# Patient Record
Sex: Male | Born: 1965 | Race: White | Hispanic: No | Marital: Single
Health system: Midwestern US, Community
[De-identification: ages and names within clinical notes are randomized; demographics above are authoritative.]

## PROBLEM LIST (undated history)

## (undated) DIAGNOSIS — M549 Dorsalgia, unspecified: Secondary | ICD-10-CM

## (undated) DIAGNOSIS — J449 Chronic obstructive pulmonary disease, unspecified: Principal | ICD-10-CM

## (undated) DIAGNOSIS — G8929 Other chronic pain: Secondary | ICD-10-CM

## (undated) DIAGNOSIS — IMO0002 Reserved for concepts with insufficient information to code with codable children: Secondary | ICD-10-CM

---

## 2014-08-29 NOTE — ED Provider Notes (Signed)
PATIENT:          Jacob Sawyer, Jacob Sawyer          DOS:           08/29/2014  MR #:             [id number]             ACCOUNT #:     [id number]  DATE OF BIRTH:    Mar 08, 1966              AGE:           48      HISTORY OF PRESENT ILLNESS:    PERTINENT HISTORY OF PRESENT  ILLNESS. Patient seen under the  supervision of  Dr. Lonia MadSheila Steer.  Patient resents to the emergency department with 4  to  five-day history of right-sided flank pain.  He states this came on  gradually  but has been continuous in nature with a sharp quality in his right  flank.  Patient reports this is worse when riding in the car, but not affected  by  movement or food.  He denies any associated nausea, vomiting,  diarrhea, or  melena.  He does endorse some urinary symptoms including dysuria and  frequency.   Patient was reportedly seen at [facility] yesterday for similar  complaints.  At that visit, a Foley catheter was placed which improved some of his  retention, but he states no further workup was obtained for stones and  he was  not treated for pain.    PERTINENT PAST/ FAMILY/SOCIAL HISTORY Medical: None  Surgical: Appendectomy  Social: Endorses tobacco use      PHYSICAL EXAM Vital signs unremarkable  General: Lying comfortably in bed  HEENT: normocephalic, nontraumatic, neck supple  CVS: Regular rate and rhythm  Resp: No distress, CTA bilaterally  Abd: Soft, nondistended, normal bowel sounds, tender to palpation  right lower  cord and right flank  Genitourinary: Foley catheter in place draining yellow urine, normal  scrotal  exam with no tenderness  Ext: Nontender, no edema  Back: Nontender, right-sided CVA tenderness  Neuro: Alert and oriented x3, normal strength and sensation  Psych: Normal affect    MEDICAL DECISION MAKING:    SIGNIFICANT FINDINGS/ED COURSE/MEDICAL DECISION MAKING/TREATMENT  PLAN Patient  presents with right-sided flank pain.  He was given Toradol and Zofran  for pain  and nausea control, respectively.  Retroperitoneal  ultrasound  obtained  demonstrating no evidence of hydronephrosis, with nonobstructing right  lower  pole renal calculus.  Complete blood count and metabolic panel  unremarkable.  Urinalysis demonstrating some occult blood, but no evidence of urinary  tract  infection.  Given findings, patient is medically stable for discharge  to home.  Upon reassessment at 2300, patient reports improvement of his pain.  He will be  discharged home with prescription for Naprosyn for pain control and  instructions for Foley catheter care, as well as followup with urology  clinic.  He is in agreement with this plan.    PROBLEM LIST:       Admit Reason:     right flank pain: Entered Date: 29-Aug-2014 21:07, Entered By:  INTERFACES,  INTERFACES      DIAGNOSIS 1.  Right-sided flank pain, acute      ADDITIONAL INFORMATION The Emergency Medicine attending physician  was present  in the Emergency Department, who reviewed case management, and  approved  evaluation/treatment.    Electronic Signatures:  Leo RodSTEER, SHEILA H (MD)  (  Signed 30-Aug-2014 01:14)   Co-Signer: HISTORY OF PRESENT ILLNESS, PROBLEM LIST  Ananias PilgrimSTEUBS, Marwin Primmer T (MD)  (Signed 29-Aug-2014 23:03)   Authored: HISTORY OF PRESENT ILLNESS, PHYSICAL EXAM, MEDICAL DECISION  MAKING,  PROBLEM LIST, DIAGNOSIS, Additional Infomation      Last Updated: 30-Aug-2014 01:14 by Leo RodSTEER, SHEILA H (MD)            Please see T-Sheet, initial assessment, and physician orders for  further details.    Dictating Physician: Clover MealyJohn Quinlan Vollmer, MD  Original Electronic Signature Date: 08/29/2014 09:55 P  JTS  Document #: [id number]    cc:  PCP No       Soarian

## 2014-08-30 ENCOUNTER — Inpatient Hospital Stay
Admit: 2014-08-30 | Discharge: 2014-08-30 | Disposition: A | Discharge status: Home / Self Care | Attending: Emergency Medicine

## 2014-08-30 LAB — CBC WITH AUTO DIFFERENTIAL
Absolute Baso #: 0.1 10*3/uL (ref 0.0–0.2)
Absolute Eos #: 0.2 10*3/uL (ref 0.0–0.5)
Absolute Lymph #: 2.3 10*3/uL (ref 1.0–4.3)
Absolute Mono #: 0.7 10*3/uL (ref 0.0–0.8)
Absolute Neut #: 4.8 10*3/uL (ref 1.8–7.0)
Basophils: 0.9 %
Eosinophils: 1.9 %
Granulocytes %: 59.7 %
Hematocrit: 39.1 % — ABNORMAL LOW (ref 40.0–52.0)
Hemoglobin: 13 g/dl (ref 13.0–18.0)
Lymphocyte %: 28.6 %
MCH: 32.5 pg (ref 26.0–34.0)
MCHC: 33.3 % (ref 32.0–36.0)
MCV: 97.5 fl (ref 80.0–98.0)
MPV: 7.6 fl (ref 7.4–10.4)
Monocytes: 8.9 %
Platelets: 204 10*3/uL (ref 140–440)
RBC: 4.01 10*6/uL — ABNORMAL LOW (ref 4.40–5.90)
RDW: 13.7 % (ref 11.5–14.5)
WBC: 8 10*3/uL (ref 3.6–10.7)

## 2014-08-30 LAB — BASIC METABOLIC PANEL
Anion Gap: 7
BUN: 15 mg/dL (ref 7–25)
CO2: 28 mmol/L (ref 21–32)
Calcium: 8.6 mg/dL (ref 8.2–10.1)
Chloride: 107 mmol/L (ref 98–109)
Creatinine: 0.68 mg/dL (ref 0.60–1.50)
EGFR IF NonAfrican American: >60 mL/min (ref >60)
Glucose: 88 mg/dL (ref 70–100)
Potassium: 3.8 mmol/L (ref 3.5–5.1)
Sodium: 142 mmol/L (ref 135–145)
eGFR African American: >60 mL/min (ref >60)

## 2014-08-30 LAB — URINALYSIS, MICRO: Epithelial Cells, Wet Prep: NEGATIVE /HPF (ref 3–5)

## 2014-08-30 LAB — URINALYSIS-MACROSCOPIC
Bilirubin, Urine: NEGATIVE
Ketones, Urine: NEGATIVE mg/dL
Leukocytes, Bld: NEGATIVE
Nitrite, Urine: NEGATIVE
Occult Blood,Urine: 50 Ery/micL
Specific Gravity, Urine: 1.005 (ref 1.005–1.030)
Total Protein, Urine: NEGATIVE mg/dL
Volume: 12
pH, Urine: 5 (ref 5.0–8.0)

## 2014-09-03 ENCOUNTER — Inpatient Hospital Stay
Admit: 2014-09-03 | Discharge: 2014-09-03 | Disposition: A | Discharge status: Home / Self Care | Attending: Emergency Medicine

## 2014-09-03 LAB — CBC WITH AUTO DIFFERENTIAL
Absolute Baso #: 0 10*3/uL (ref 0.0–0.2)
Absolute Eos #: 0.1 10*3/uL (ref 0.0–0.5)
Absolute Lymph #: 2 10*3/uL (ref 1.0–4.3)
Absolute Mono #: 0.6 10*3/uL (ref 0.0–0.8)
Absolute Neut #: 4 10*3/uL (ref 1.8–7.0)
Basophils: 0.5 %
Eosinophils: 1.9 %
Granulocytes %: 59.3 %
Hematocrit: 38.9 % — ABNORMAL LOW (ref 40.0–52.0)
Hemoglobin: 12.9 g/dl — ABNORMAL LOW (ref 13.0–18.0)
Lymphocyte %: 28.9 %
MCH: 32.7 pg (ref 26.0–34.0)
MCHC: 33.3 % (ref 32.0–36.0)
MCV: 98 fl (ref 80.0–98.0)
MPV: 8 fl (ref 7.4–10.4)
Monocytes: 9.4 %
Platelets: 203 10*3/uL (ref 140–440)
RBC: 3.97 10*6/uL — ABNORMAL LOW (ref 4.40–5.90)
RDW: 13.8 % (ref 11.5–14.5)
WBC: 6.8 10*3/uL (ref 3.6–10.7)

## 2014-09-03 LAB — URINALYSIS-MACROSCOPIC
Bilirubin, Urine: NEGATIVE
Ketones, Urine: NEGATIVE mg/dL
Leukocytes, Bld: NEGATIVE
Nitrite, Urine: NEGATIVE
Occult Blood,Urine: NEGATIVE Ery/micL
Specific Gravity, Urine: 1.01 (ref 1.005–1.030)
Total Protein, Urine: NEGATIVE mg/dL
Volume: 12
pH, Urine: 8 (ref 5.0–8.0)

## 2014-09-03 LAB — BASIC METABOLIC PANEL
Anion Gap: 6
BUN: 12 mg/dL (ref 7–25)
CO2: 29 mmol/L (ref 21–32)
Calcium: 8.9 mg/dL (ref 8.2–10.1)
Chloride: 107 mmol/L (ref 98–109)
Creatinine: 0.64 mg/dL (ref 0.60–1.50)
EGFR IF NonAfrican American: >60 mL/min (ref >60)
Glucose: 74 mg/dL (ref 70–100)
Potassium: 4 mmol/L (ref 3.5–5.1)
Sodium: 142 mmol/L (ref 135–145)
eGFR African American: >60 mL/min (ref >60)

## 2014-09-03 NOTE — ED Provider Notes (Signed)
PATIENT:          Jacob Sawyer, Jacob Sawyer          DOS:           09/03/2014  MR #:             [id number]             ACCOUNT #:     [id number]  DATE OF BIRTH:    08-08-1966              AGE:           48      HISTORY OF PRESENT ILLNESS:    PERTINENT HISTORY OF PRESENT  ILLNESS. The patient was seen under  the  supervision of Dr. Jesus GeneraPeter Listerman.  This is a 48 year old male who  presents  with right flank pain.  This is his third emergency department visit  with him  last week for similar symptoms.  He originally went to [facility]  because he  could not urinate and a catheter was placed.  Then the next couple of  days he  continued to have severe right flank pain and was seen in the  emergency  department here.  Nothing was found at that time.  He has been in  intermittent  severe pain since then.  He has had intermittent diarrhea but denies  nausea,  vomiting, fever or chills.  He did notice blood in his Foley bag  earlier today.   He does not have other complaints at this time.    PERTINENT PAST/ FAMILY/SOCIAL HISTORY PMH: None  PSH: Appendectomy  Social HX: Admits to tobacco use.  Denies alcohol or illicit drug use      PHYSICAL EXAM Vital signs: Within normal limits  General: Well-nourished and well-developed male lying in bed in no  acute  distress  Head: Normocephalic and atraumatic  Eyes: Pupils are equally round and reactive to light with extraocular  eye  movements intact.  Neck: Supple without lymphadenopathy  CVS: Regular rate and rhythm without murmurs  Respiratory: Lungs are clear to auscultation bilaterally with no  audible  wheezing or rhonchi  Abdomen: Soft, nontender, nondistended with normal bowel sounds  throughout.  There are no palpable masses.  Back: Right CVA tenderness  Extremities: Nontender and nonedematous  Neurological: Normal strength and sensation bilaterally    MEDICAL DECISION MAKING:    SIGNIFICANT FINDINGS/ED COURSE/MEDICAL DECISION MAKING/TREATMENT  PLAN He was  treated with morphine  and Zofran.  Labs, urinalysis and CT were  obtained.  Complete blood count was unremarkable with a slightly low hemoglobin  of 12.9.  BMP was was unremarkable.  His urinalysis was negative for leukocytes,  nitrates  or blood.  A computed tomography scan did reveal a right instructing  rhythm  without lives with no evidence of urolithiasis or other acute  disease.  I reviewed the patient's records and he had a urine obtained a couple  of days  ago which revealed calcium oxalate crystals.  He has had intermittent  pain  since then.  He has a stone in his right kidney but none in the ureter  today.  It is possible that he could be passing small stones.  He will be  treated for a  kidney stone with a short course of Percocet, Flomax and Naprosyn.  His foley  catheter was removed since it has been in for 10 days he was  instructed to  followup with urology as scheduled.  He did want his catheter  removed.    PROBLEM LIST:       Admit Reason:     flank pain: Entered Date: 03-Sep-2014 14:38, Entered By:  Standley BrookingINTERFACES,  INTERFACES      DIAGNOSIS RIGHT FLANK PAIN      ADDITIONAL INFORMATION If the physician assistant/nurse practitioner  was  involved in patient care, I personally performed and participated in  all the  above services (including HPI and PE). I have reviewed with the  physician  assistant/nurse practitioner the history and confirmed the findings  with the  patient. I personally performed all surgical procedures in the medical  record  unless otherwise indicated.    COPIES SENT TO::     NO, PCP DOCTOR(PCP): [id number]    Electronic Signatures:  Jesus GeneraLISTERMAN, PETER (MD)  (Signed 03-Sep-2014 18:00)   Authored: MEDICAL DECISION MAKING   Co-Signer: HISTORY OF PRESENT ILLNESS, PHYSICAL EXAM, PROBLEM LIST,  Additional  Infomation, Copies to be sent to:  Sanjuan DameMARCHAK, Rhyse Loux G (PA)  (Signed 03-Sep-2014 17:55)   Authored: HISTORY OF PRESENT ILLNESS, PHYSICAL EXAM, MEDICAL DECISION  MAKING,  PROBLEM LIST, DIAGNOSIS, Additional  Infomation, Copies to be sent to:      Last Updated: 03-Sep-2014 18:00 by Jesus GeneraLISTERMAN, PETER (MD)            Please see T-Sheet, initial assessment, and physician orders for  further details.    Dictating Physician: Sanjuan DameStephanie G Kyrah Schiro, PA  Original Electronic Signature Date: 09/03/2014 02:52 P  SGM  Document #: [id number]    cc:  PCP No       Soarian

## 2014-09-20 ENCOUNTER — Inpatient Hospital Stay
Admit: 2014-09-20 | Discharge: 2014-09-20 | Disposition: A | Discharge status: Home / Self Care | Attending: Emergency Medicine

## 2014-09-20 LAB — URINALYSIS-MACROSCOPIC
Bilirubin, Urine: NEGATIVE
Ketones, Urine: NEGATIVE mg/dL
Leukocytes, Bld: NEGATIVE
Nitrite, Urine: NEGATIVE
Occult Blood,Urine: NEGATIVE Ery/micL
Specific Gravity, Urine: 1.01 (ref 1.005–1.030)
Total Protein, Urine: NEGATIVE mg/dL
Volume: 12
pH, Urine: 8 (ref 5.0–8.0)

## 2014-09-20 NOTE — ED Provider Notes (Signed)
PATIENT:          Jacob Sawyer, Jacob Sawyer          DOS:           09/20/2014  MR #:             [id number]             ACCOUNT #:     [id number]  DATE OF BIRTH:    [birth year]              AGE:           48      HISTORY OF PRESENT ILLNESS:    PERTINENT HISTORY OF PRESENT  ILLNESS. Patient chief complaint of  blood in  his urine.  He was seen in the ED earlier today and given Pyridium for  his  bladder pain.  He did not know that it would turn his urine different  color.    PERTINENT PAST/ FAMILY/SOCIAL HISTORY See previous note      PHYSICAL EXAM Vitals stable.  Exam unremarkable    MEDICAL DECISION MAKING:    SIGNIFICANT FINDINGS/ED COURSE/MEDICAL DECISION MAKING/TREATMENT  PLAN Patient  reassured about his urine color and asked to followup with urology    PROBLEM LIST:       ED Diagnosis:     Medication side effect (T88.7XXA): Entered Date: 20-Sep-2014 17:42,  Entered  By: Samule Ohm, Status: Active, ICD-10: T88.7XXA     Urinary retention (R33.9): Entered Date: 20-Sep-2014 17:42, Entered  By:  Samule Ohm, Status: Active, ICD-10: R33.9    COPIES SENT TO::     NO, PCP DOCTOR(PCP): [id number]    Electronic Signatures:  Samule Ohm (MD)  (Signed [phone] 17:42)   Authored: HISTORY OF PRESENT ILLNESS, PHYSICAL EXAM, MEDICAL DECISION  MAKING,  PROBLEM LIST, Copies to be sent to:      Last Updated: 20-Sep-2014 17:42 by Samule Ohm (MD)            Please see T-Sheet, initial assessment, and physician orders for  further details.    Dictating Physician: Samule Ohm, MD  Original Electronic Signature Date: 09/20/2014 05:42 P  Document #: [id number]    cc:  PCP No       Soarian

## 2014-09-20 NOTE — ED Provider Notes (Signed)
PATIENT:          Jacob Sawyer, Jacob Sawyer          DOS:           09/20/2014  MR #:             [id number]             ACCOUNT #:     [id number]  DATE OF BIRTH:    [birth year]              AGE:           48      HISTORY OF PRESENT ILLNESS:    PERTINENT HISTORY OF PRESENT  ILLNESS. This patient was seen with  Dr.  Delene Ruffini.  Chief complaint: Urinary retention  Patient presents complaining of urinary retention with last void at 10  p.m.  last evening, over 12 hours ago.  He has had symptoms like this in the  past and  has been seen in the emergency room for this a month ago.  He had a  Foley  catheter placed in the past which was removed last month.  Since then  he has  had difficulty starting a stream but is typically able to void.  He  has seen a  urologist who did a cystoscopy reporting normal findings, normal  prostate.  He  cannot recall the name of his urologist but he does have an  appointment to see  him in 2 weeks.  He has mild bladder pain with this    PERTINENT PAST/ FAMILY/SOCIAL HISTORY PMH: Kidney stones  PSH: Appendectomy  Social history: Smokes cigarettes      PHYSICAL EXAM Signs are noted and stable.  Well-nourished,  well-developed, no  acute distress.  He has a regular heart rate and rhythm with no  murmurs.  He is  in no respiratory distress and lungs are clear to auscultation  bilaterally.  Back is nontender, no CVA tenderness.  Abdomen is nondistended with  normal  bowel sounds and soft palpation.  He does have some mild suprapubic  tenderness  on palpation.    MEDICAL DECISION MAKING:    SIGNIFICANT FINDINGS/ED COURSE/MEDICAL DECISION MAKING/TREATMENT  PLAN  Indwelling Foley catheter placed by nursing which drained 150 mL of  clear  yellow urine.  Patient reports improvement after this.  He is given  Pyridium  for his discomfort.  Urinalysis shows no sign of infection.  He will  be  discharged with a leg bag and followup with his urologist as  scheduled.  He  will return with any worsening  symptoms.    PROBLEM LIST:         ED Diagnosis:     Urinary retention (R33.9): Entered Date: 20-Sep-2014 12:43, Entered  By:  Samule Ohm, Status: Active, ICD-10: R33.9      DIAGNOSIS Urinary retention      ADDITIONAL INFORMATION If the physician assistant/nurse practitioner  was  involved in patient care, I personally performed and participated in  all the  above services (including HPI and PE). I have reviewed with the  physician  assistant/nurse practitioner the history and confirmed the findings  with the  patient. I personally performed all surgical procedures in the medical  record  unless otherwise indicated.    COPIES SENT TO::     NO, PCP DOCTOR(PCP): [id number]    Electronic Signatures:  Janean Sark L (NP)  (Signed [phone] 12:37)   Authored: HISTORY OF PRESENT ILLNESS, PHYSICAL  EXAM, MEDICAL DECISION  MAKING,  PROBLEM LIST, DIAGNOSIS, Additional Infomation, Copies to be sent to:  Samule Ohm (MD)  (Signed [phone] 12:43)   Authored: PROBLEM LIST   Co-Signer: HISTORY OF PRESENT ILLNESS, PHYSICAL EXAM, MEDICAL  DECISION MAKING,  PROBLEM LIST, DIAGNOSIS, Additional Infomation, Copies to be sent to:      Last Updated: 20-Sep-2014 12:43 by Samule Ohm (MD)            Please see T-Sheet, initial assessment, and physician orders for  further details.    Dictating Physician: Edwinna Areola, CNP  Original Electronic Signature Date: 09/20/2014 12:37 P  JE  Document #: [id number]    cc:  PCP No       Soarian

## 2014-09-25 ENCOUNTER — Inpatient Hospital Stay: Admit: 2014-09-25 | Discharge: 2014-09-25 | Disposition: A | Discharge status: Home / Self Care | Attending: Hospitalist

## 2014-09-25 LAB — URINALYSIS-MACROSCOPIC
Bilirubin, Urine: NEGATIVE
Ketones, Urine: NEGATIVE mg/dL
Leukocytes, Bld: NEGATIVE
Nitrite, Urine: NEGATIVE
Occult Blood,Urine: 50 Ery/micL
Specific Gravity, Urine: 1.01 (ref 1.005–1.030)
Total Protein, Urine: NEGATIVE mg/dL
Volume: 12
pH, Urine: 8 (ref 5.0–8.0)

## 2014-09-25 LAB — URINALYSIS, MICRO: Epithelial Cells, Wet Prep: NEGATIVE /HPF (ref 3–5)

## 2014-09-25 NOTE — ED Provider Notes (Signed)
PATIENT:          Jacob Sawyer, Jacob Sawyer          DOS:           09/25/2014  MR #:             [id number]             ACCOUNT #:     [id number]  DATE OF BIRTH:    19-Mar-1966              AGE:           48      HISTORY OF PRESENT ILLNESS:    PERTINENT HISTORY OF PRESENT  ILLNESS. The patient was seen and  examined with  Dr. Janie Morning.  This is a 48 year old male who presents with a  chief  complaint of catheter irritation that began suddenly today while he  was outside  chopping wood.  The patient states he has sharp suprapubic pain,  penile pain,  and scrotum pain that is worsened by walking and relieved by rest.  Additionally, the patient states he has had dysuria for the past 2  days as well  as bilateral flank pain and urinary urgency.  The patient states he  had a  catheter placed 5 days ago due to urinary retention.  The patient has  an  appointment with urologist Dr. Hazle Quant in one week, but is concerned  about the  new pain and irritation he is having.  The patient states he has a  known kidney  stone.  Also, the patient has had diarrhea for the past 4-5 days.  The  patient  denies hematuria, nausea, vomiting, and fever.    PERTINENT PAST/ FAMILY/SOCIAL HISTORY Muscle spasms, on Neurontin  Tobacco abuse      PHYSICAL EXAM Vital signs are normal and patient is afebrile.  Patient is a  well-developed, well-nourished male who appears nontoxic and in no  acute  distress.  Head is normocephalic and atraumatic.  Pupils are equal,  round, and  reactive to light.  Extraocular movements are intact. Ears, nose, and  throat  are unremarkable.  Neck is supple.  Regular heart rate and rhythm.  Normal S1  and S2.  No respiratory distress.  Diminished lung sounds bilaterally.  Abdomen  is soft and nondistended with normal bowel sounds.  Suprapubic  tenderness to  palpation. No abdominal masses are felt.  No CVA tenderness.  Extremities are  nontender with no edema.  Foley catheter in place with no penile  discharge  or  cellulitis.  Catheter draining yellow urine. Skin is normal color.  Patient is  alert and oriented.  Cranial nerves II through XII are intact.    MEDICAL DECISION MAKING:    SIGNIFICANT FINDINGS/ED COURSE/MEDICAL DECISION MAKING/TREATMENT  PLAN The  patient's Foley catheter was removed and the patient tolerated this  procedure  well.  The patient was able to provide a urine sample after removing  the Foley  catheter without any difficulty urinating.  The patient was given one  Percocet  for his pain.  Urinalysis showed no infection.  The patient did have  blood and  red blood cells in the urine with amorphous phosphates and few calcium  oxylate  crystals, likely indicating a kidney stone.  The patient was given one  dose of  Flomax here in the Emergency Department.  The patient will also be  given a  prescription of Flomax for home.  The  patient should followup with his  PCP if  he continues to have pain and dysuria.  The patient should keep his  appointment  with his urologist next week.  The patient understands discharge  instructions  and is agreeable to the plan.  The patient is stable.    PROBLEM LIST:       ED Diagnosis:     Encounter for Foley catheter removal (Z46.6): Entered Date:  25-Sep-2014  18:45, Entered By: Maeola Sarah, Status: Active, ICD-10: Z46.6     Hematuria (R31.9): Entered Date: 25-Sep-2014 18:44, Entered By:  Maeola Sarah, Status: Active, ICD-10: R31.9       Admit Reason:     Cath Problems: Entered Date: 25-Sep-2014 17:20, Entered By:  Denton Ar, Status: Active      DIAGNOSIS 1.  Hematuria  2.  Foley catheter removal      ADDITIONAL INFORMATION The Emergency Medicine attending physician  was present  in the Emergency Department, who reviewed case management, and  approved  evaluation/treatment.    COPIES SENT TO::     NO, PCP DOCTOR(PCP): [id number]    Electronic Signatures:  Maeola Sarah (MD)  (Signed 25-Sep-2014 18:45)   Authored: HISTORY OF PRESENT ILLNESS,  PHYSICAL EXAM, MEDICAL DECISION  MAKING,  PROBLEM LIST, DIAGNOSIS, Additional Infomation, Copies to be sent to:  Janie Morning (MD)  (Signed 25-Sep-2014 19:23)   Co-Signer: HISTORY OF PRESENT ILLNESS, PROBLEM LIST, Copies to be  sent to:      Last Updated: 25-Sep-2014 19:23 by Janie Morning (MD)            Please see T-Sheet, initial assessment, and physician orders for  further details.    Dictating Physician: Maeola Sarah, MD  Original Electronic Signature Date: 09/25/2014 06:21 P  BMM  Document #: [id number]    cc:  PCP No       Soarian

## 2014-10-25 NOTE — ED Provider Notes (Signed)
PATIENT:          Jacob Sawyer, [name]          DOS:           10/25/2014  MR #:             [id number]             ACCOUNT #:     [id number]  DATE OF BIRTH:    1965/11/20              AGE:           48      HISTORY OF PRESENT ILLNESS:    PERTINENT HISTORY OF PRESENT  ILLNESS. Patient was seen with Dr.  Everlene OtherMcQuown.  Patient is a chief complaint of seizure episode.  Patient is brought  in by  paramedics.  Paramedics stated the patient had 6 seizure episodes of  marked  history.  There is redness and one episode, generalized tonic clonic  that  lasted less than 1 minute and self resolved.  Patient's last seizure  was one  week ago.  Patient has not taken Depakote in over 5 years.  Review of  system  positive for cough and dysuria.    PERTINENT PAST/ FAMILY/SOCIAL HISTORY Seizure  Appendectomy      PHYSICAL EXAM Vital signs are normal  Awake, alert  Left 2 cm scalp hematoma with dried blood  Pupils are equal round reactive  Chest: Regular, rate and rhythm.  No murmurs.  Respiratory: Clear to auscultation bilaterally.  Abdomen: Soft, nontender, nondistended.  No focal deficit    MEDICAL DECISION MAKING:    SIGNIFICANT FINDINGS/ED COURSE/MEDICAL DECISION MAKING/TREATMENT  PLAN CT head  and C-spine is negative for hemorrhage and fracture.  Complete blood  count, BMP  is unremarkable.  Chest x-ray is clear.  is normal.  Patient had 2  episode of  generalized tonic-clonic seizure activity, one was 20 seconds and the  second  one was 45 seconds it self resolved.  Patient is given weight-based  phenytoin.  On repeat exam, he is awake and alert, answering questions.  Patient  was  initially refusing admission however eventually is agreeable.  He had  another  seizure and was given ativan. He had another seizure and was admitted  to the  ICU alert and awake.    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    COPIES SENT TO::     NO, PCP DOCTOR(PCP): [id number]    Electronic Signatures:  Mitchell HeirKUMAR, Greidy Sherard (MD)  (Signed 25-Oct-2014 21:45)   Authored: HISTORY OF PRESENT ILLNESS, PHYSICAL EXAM, MEDICAL DECISION  MAKING,  PROBLEM LIST, DIAGNOSIS, Additional Infomation, Copies to be sent to:  Margo CommonMCQUOWN, COLLEEN MARIE (MD)  (Signed 02-Nov-2014 20:47)   Authored: MEDICAL DECISION MAKING, PROBLEM LIST   Co-Signer: HISTORY OF PRESENT ILLNESS, PHYSICAL EXAM, MEDICAL  DECISION MAKING,  PROBLEM LIST, DIAGNOSIS, Additional Infomation, Copies to be sent to:  Last Updated: 02-Nov-2014 20:47 by Margo CommonMCQUOWN, COLLEEN MARIE (MD)            Please see T-Sheet, initial assessment, and physician orders for  further details.    Dictating Physician: Mitchell HeirNeha Ranell Finelli, MD  Original Electronic Signature Date: 10/25/2014 09:45 P  NK  Document #: [id number]    cc:  PCP No       Soarian

## 2014-10-26 ENCOUNTER — Inpatient Hospital Stay
Admit: 2014-10-26 | Discharge: 2014-10-26 | Disposition: A | Discharge status: Other Acute Inpatient Hospital | Admitting: Critical Care Medicine

## 2014-10-26 ENCOUNTER — Inpatient Hospital Stay
Admit: 2014-10-26 | Discharge: 2014-10-29 | Disposition: A | Discharge status: Home / Self Care | Attending: Psychiatry | Admitting: Psychiatry

## 2014-10-26 LAB — BASIC METABOLIC PANEL
Anion Gap: 7
Anion Gap: 6
BUN: 15 mg/dL (ref 7–25)
BUN: 12 mg/dL (ref 7–25)
CO2: 29 mmol/L (ref 21–32)
CO2: 29 mmol/L (ref 21–32)
Calcium: 8.8 mg/dL (ref 8.2–10.1)
Calcium: 8.5 mg/dL (ref 8.2–10.1)
Chloride: 106 mmol/L (ref 98–109)
Chloride: 110 mmol/L — ABNORMAL HIGH (ref 98–109)
Creatinine: 0.94 mg/dL (ref 0.60–1.50)
Creatinine: 0.8 mg/dL (ref 0.60–1.50)
EGFR IF NonAfrican American: >60 mL/min (ref >60)
EGFR IF NonAfrican American: >60 mL/min (ref >60)
Glucose: 120 mg/dL — ABNORMAL HIGH (ref 70–100)
Glucose: 86 mg/dL (ref 70–100)
Potassium: 3.8 mmol/L (ref 3.5–5.1)
Potassium: 3.8 mmol/L (ref 3.5–5.1)
Sodium: 142 mmol/L (ref 135–145)
Sodium: 145 mmol/L (ref 135–145)
eGFR African American: >60 mL/min (ref >60)
eGFR African American: >60 mL/min (ref >60)

## 2014-10-26 LAB — HEPATIC FUNCTION PANEL
ALT: 23 U/L (ref 12–78)
AST: 25 U/L (ref 15–37)
Albumin,Serum: 3.8 g/dL (ref 3.1–4.6)
Alkaline Phosphatase: 103 U/L (ref 45–117)
Bilirubin, Direct: 0.1 mg/dL (ref 0.0–0.2)
Total Bilirubin: 0.6 mg/dL (ref 0.2–1.0)
Total Protein: 7.3 g/dL (ref 6.4–8.2)

## 2014-10-26 LAB — CBC WITH AUTO DIFFERENTIAL
Absolute Baso #: 0.1 10*3/uL (ref 0.0–0.2)
Absolute Eos #: 0.1 10*3/uL (ref 0.0–0.5)
Absolute Lymph #: 2.6 10*3/uL (ref 1.0–4.3)
Absolute Mono #: 0.8 10*3/uL (ref 0.0–0.8)
Absolute Neut #: 4.5 10*3/uL (ref 1.8–7.0)
Basophils: 0.6 %
Eosinophils: 1.6 %
Granulocytes %: 55.6 %
Hematocrit: 38.5 % — ABNORMAL LOW (ref 40.0–52.0)
Hemoglobin: 12.6 g/dl — ABNORMAL LOW (ref 13.0–18.0)
Lymphocyte %: 32.3 %
MCH: 32.1 pg (ref 26.0–34.0)
MCHC: 32.8 % (ref 32.0–36.0)
MCV: 97.8 fl (ref 80.0–98.0)
MPV: 7.7 fl (ref 7.4–10.4)
Monocytes: 9.9 %
Platelets: 200 10*3/uL (ref 140–440)
RBC: 3.94 10*6/uL — ABNORMAL LOW (ref 4.40–5.90)
RDW: 13.3 % (ref 11.5–14.5)
WBC: 8 10*3/uL (ref 3.6–10.7)

## 2014-10-26 LAB — DRUGS OF ABUSE, URINE
Amphetamines, urine: NEGATIVE
Barbiturates, Urine: NEGATIVE
Benzodiazepine Ur Qual: NEGATIVE
Cocaine Metabolites, Ur: NEGATIVE
Methadone, Urine: NEGATIVE
Opiates, Urine: NEGATIVE
Oxycodone Screen, Ur: NEGATIVE
PCP, Urine: NEGATIVE

## 2014-10-26 LAB — TSH: TSH: 1.38 micUnt/mL (ref 0.36–3.74)

## 2014-10-26 LAB — LACTIC ACID: Lactic Acid: 0.5 mmol/L (ref 0.4–2.0)

## 2014-10-26 LAB — CK-MB INDEX
CK-MB Index: 1 (ref 0.0–4.0)
CK-MB: 3.4 ng/mL (ref 0.5–3.6)

## 2014-10-26 LAB — CBC
Hematocrit: 35.2 % — ABNORMAL LOW (ref 40.0–52.0)
Hemoglobin: 11.6 g/dl — ABNORMAL LOW (ref 13.0–18.0)
MCH: 32 pg (ref 26.0–34.0)
MCHC: 33 % (ref 32.0–36.0)
MCV: 96.8 fl (ref 80.0–98.0)
MPV: 7.5 fl (ref 7.4–10.4)
Platelets: 178 10*3/uL (ref 140–440)
RBC: 3.63 10*6/uL — ABNORMAL LOW (ref 4.40–5.90)
RDW: 13.1 % (ref 11.5–14.5)
WBC: 6 10*3/uL (ref 3.6–10.7)

## 2014-10-26 LAB — URINALYSIS-MACROSCOPIC
Bilirubin, Urine: NEGATIVE
Ketones, Urine: NEGATIVE mg/dL
Leukocytes, Bld: NEGATIVE
Nitrite, Urine: NEGATIVE
Occult Blood,Urine: NEGATIVE Ery/micL
Specific Gravity, Urine: 1.01 (ref 1.005–1.030)
Total Protein, Urine: NEGATIVE mg/dL
Urobilinogen, Urine: 1 mg/dL (ref 0–1)
Volume: 12
pH, Urine: 7 (ref 5.0–8.0)

## 2014-10-26 LAB — PHOSPHORUS
Phosphorus: 3.3 mg/dL (ref 2.5–5.0)
Phosphorus: 3.7 mg/dL (ref 2.5–5.0)

## 2014-10-26 LAB — CK WITH REFLEX CK-MB: Total CK: 343 U/L — ABNORMAL HIGH (ref 39–308)

## 2014-10-26 LAB — ETHANOL: Ethanol Lvl: <0.003 g/dL (ref 0.000–0.003)

## 2014-10-26 LAB — PROLACTIN: Prolactin: 17.4 ng/mL

## 2014-10-26 LAB — MAGNESIUM
Magnesium: 1.9 mg/dL (ref 1.8–2.4)
Magnesium: 1.9 mg/dL (ref 1.8–2.4)

## 2014-10-29 LAB — COMPREHENSIVE METABOLIC PANEL
ALT: 26 U/L (ref 12–78)
AST: 36 U/L (ref 15–37)
Albumin,Serum: 3.6 g/dL (ref 3.1–4.6)
Alkaline Phosphatase: 102 U/L (ref 45–117)
Anion Gap: 8
BUN: 16 mg/dL (ref 7–25)
CO2: 29 mmol/L (ref 21–32)
Calcium: 8.9 mg/dL (ref 8.2–10.1)
Chloride: 105 mmol/L (ref 98–109)
Creatinine: 0.7 mg/dL (ref 0.60–1.50)
EGFR IF NonAfrican American: >60 mL/min (ref >60)
Glucose: 78 mg/dL (ref 70–100)
Potassium: 3.8 mmol/L (ref 3.5–5.1)
Sodium: 141 mmol/L (ref 135–145)
Total Bilirubin: 0.5 mg/dL (ref 0.2–1.0)
Total Protein: 7.4 g/dL (ref 6.4–8.2)
eGFR African American: >60 mL/min (ref >60)

## 2014-10-29 LAB — VALPROIC ACID LEVEL, TOTAL: Valproic Acid Lvl: 60 ug/mL (ref 50–100)

## 2014-10-29 LAB — AMMONIA: Ammonia: 20 mmol/L (ref 11–32)

## 2014-10-29 NOTE — Discharge Summary (Signed)
PATIENT:             Jacob Sawyer, Jacob Sawyer                     ADMIT DATE:            10/26/2014  MEDICAL RECORD NUMBER:     [id number]                        DISCHARGE DATE:        10/29/2014  ACCOUNT #:           [id number]                       DATE OF BIRTH:         06/29/1966                                                          AGE:                   48                                      Electronically Authenticated                           Niaomi Cartaya P. Tiburcio Pea, MD 10/30/2014 12:22 P    ATTENDING PHYSICIAN:  Lillianna Sabel P. Tiburcio Pea, MD    DICTATING PHYSICIAN:  Nixon Sparr P. Harris Penton, MD    TOTAL TIME SPENT ON DISCHARGE:   30 minutes.    INFORMATION AND REASON FOR HOSPITALIZATION:  This patient is a  48 year old divorced, white male, he was admitted with confusion,  irritability, and agitation, following a seizure.    HOSPITAL COURSE AND TREATMENT:  The patient was admitted to the  inpatient psychiatric unit at [facility].  He was  initially seen and treated at [facility] following a seizure.  There, he became very belligerent, agitated and threatening to leave.  He was seen by Dr. Leanna Sato, who transferred him to the [facility] unit for  further stabilization.  He stabilized quickly once he was placed on  Depakote.  He did not display any psychosis following the post-ictal  phase of his seizure, nor did he display any agitation or  irritability.  He was compliant throughout his entire stay on the  unit.  We did transfer him to the general psychiatric unit where he  was again monitored closely throughout his stay.  We worked with him  in regards to making arrangements for follow-up with the [facility]icine [facility] to help manage his medications for his seizure  disorder.  He was given the diagnosis of mood disorder, but there was  again, no evidence of psychosis, following this post-ictal period, nor  did he display any suicidal or homicidal ideation.  On the day of  discharge I met with the patient.  He was  pleasant and he was future  oriented.  Again, he states that this must have all occurred because  of his seizures. He had not been compliant with his Depakote and now  knows that he needs to take it  on a regular basis. He was thankful  that we are making arrangements for him to follow up with the Internal  Medicine Clinic, as well, as I strongly encouraged him to follow up  with Chana Bode, that we can continue to monitor his mood while on the  Depakote, as well as using the Depakote for his seizures.  He was  pleasant and future oriented.  He denied any suicidal or homicidal  ideation.    LABORATORY STUDIES:   His discharged labs were reviewed.  Depakote  level on the day of discharge was 60.  Ammonia level 20.  His CMP was  within normal limits.    CONDITION ON DISCHARGE:  Improved.    DISCHARGE MENTAL STATUS EXAMINATION:  In general, he is alert.  He is  oriented to person, place, date and time.  Friendly and cooperative.  Good eye contact.  Well-groomed, dressed in hospital clothing. His  gait and station were intact.  His muscle strength and tone was  intact.  He was well-groomed, dressed in his own clothing. His mood  was "better."  Affect was full and appropriate.  No lability noted.  Thought content was absent of any active suicidal or homicidal  ideation.  There was no psychosis.  Thought processes were goal  oriented, direct and linear.  His associations were intact.  His  insight and judgment showed significant improvement.    DISCHARGE DIAGNOSES:    AXIS I:  1.     MOOD DISORDER NOT OTHERWISE SPECIFIED.  2.     RULE OUT BIPOLAR DISORDER.  3.    RULE OUT MAJOR DEPRESSIVE DISORDER.                       AXIS II:   DEFERRED.    AXIS III:   SEIZURE DISORDER.    AXIS IV:  SEVERITY OF PSYCHOSOCIAL STRESSORS MILD.    AXIS V:  CURRENT GAF OF APPROXIMATELY 45.    DISCHARGE DIET:    Regular.    DISCHARGE ACTIVITY:   As tolerated.    DISCHARGE MEDICATIONS:  1.     Depakote 500 mg p.o. t.i.d.  2.     Flomax 0.4 mg p.o.  daily.    DISCHARGE INSTRUCTIONS:  The patient is being discharge to home. He is  to follow up with the Internal Medicine Clinic at [facility]  on 11/05/2014 at 3 p.m.  He will follow up [employer] for ongoing management and observation of his underlying mood  disorder.  He has been instructed that he can contract my office or  contact Ethan with any questions, concerns, or in crisis  prior to any outpatient appointments.            Trever Streater P. Tiburcio Pea, MD    DOD:10/29/2014 09:38 A  BW/tm  DOT:10/29/2014 10:04 A  Job Number: [id number]  Document Number: [id number]  cc:   Samyrah Bruster P. Tiburcio Pea, [place], [place] [address]

## 2014-11-05 ENCOUNTER — Encounter: Attending: Internal Medicine

## 2014-11-05 NOTE — Telephone Encounter (Signed)
Received records from [facility] for admission in 10/2014. Pt was admitted for seizures and started on depakote. He was also threatening to leave and was evaluated by Dr Shari ProwsIvan who was unable to rule out an underlying mood disorder. Per records pt threatened to jump off a bridge and had violent behavior and acting out. Per [facility], pt was stabilized on depakote prior to discharge

## 2014-11-23 ENCOUNTER — Ambulatory Visit: Admit: 2014-11-23 | Payer: MEDICAID | Attending: Internal Medicine

## 2014-11-23 DIAGNOSIS — M5440 Lumbago with sciatica, unspecified side: Secondary | ICD-10-CM

## 2014-11-23 MED ORDER — NICOTINE 14 MG/24HR TD PT24
14 MG/24HR | MEDICATED_PATCH | TRANSDERMAL | Status: DC
Start: 2014-11-23 — End: 2015-03-05

## 2014-11-23 MED ORDER — IBUPROFEN 200 MG PO TABS
200 MG | ORAL_TABLET | Freq: Four times a day (QID) | ORAL | Status: AC | PRN
Start: 2014-11-23 — End: ?

## 2014-11-23 MED ORDER — NICOTINE POLACRILEX 2 MG MT GUM
2 | OROMUCOSAL | Status: DC | PRN
Start: 2014-11-23 — End: 2015-03-05

## 2014-11-23 MED ORDER — GABAPENTIN 600 MG PO TABS
600 MG | ORAL_TABLET | Freq: Three times a day (TID) | ORAL | Status: DC
Start: 2014-11-23 — End: 2015-03-05

## 2014-11-23 MED ORDER — ACETAMINOPHEN 500 MG PO TABS
500 MG | ORAL_TABLET | Freq: Four times a day (QID) | ORAL | Status: AC | PRN
Start: 2014-11-23 — End: ?

## 2014-11-23 NOTE — Patient Instructions (Addendum)
Stopping Smoking: Care Instructions  Your Care Instructions  Cigarette smokers crave the nicotine in cigarettes. Giving it up is much harder than simply changing a habit. Your body has to stop craving the nicotine. It is hard to quit, but you can do it. There are many tools that people use to quit smoking. You may find that combining tools works best for you.  There are several steps to quitting. First you get ready to quit. Then you get support to help you. After that, you learn new skills and behaviors to become a nonsmoker. For many people, a necessary step is getting and using medicine.  Your doctor will help you set up the plan that best meets your needs. You may want to attend a smoking cessation program to help you quit smoking. When you choose a program, look for one that has proven success. Ask your doctor for ideas. You will greatly increase your chances of success if you take medicine as well as get counseling or join a cessation program.  Some of the changes you feel when you first quit tobacco are uncomfortable. Your body will miss the nicotine at first, and you may feel short-tempered and grumpy. You may have trouble sleeping or concentrating. Medicine can help you deal with these symptoms. You may struggle with changing your smoking habits and rituals. The last step is the tricky one: Be prepared for the smoking urge to continue for a time. This is a lot to deal with, but keep at it. You will feel better.  Follow-up care is a key part of your treatment and safety. Be sure to make and go to all appointments, and call your doctor if you are having problems. It???s also a good idea to know your test results and keep a list of the medicines you take.  How can you care for yourself at home?  ?? Ask your family, friends, and coworkers for support. You have a better chance of quitting if you have help and support.  ?? Join a support group, such as Nicotine Anonymous, for people who are trying to quit  smoking.  ?? Consider signing up for a smoking cessation program, such as the American Lung Association's Freedom from Smoking program.  ?? Set a quit date. Pick your date carefully so that it is not right in the middle of a big deadline or stressful time. Once you quit, do not even take a puff. Get rid of all ashtrays and lighters after your last cigarette. Clean your house and your clothes so that they do not smell of smoke.  ?? Learn how to be a nonsmoker. Think about ways you can avoid those things that make you reach for a cigarette.  ?? Avoid situations that put you at greatest risk for smoking. For some people, it is hard to have a drink with friends without smoking. For others, they might skip a coffee break with coworkers who smoke.  ?? Change your daily routine. Take a different route to work or eat a meal in a different place.  ?? Cut down on stress. Calm yourself or release tension by doing an activity you enjoy, such as reading a book, taking a hot bath, or gardening.  ?? Talk to your doctor or pharmacist about nicotine replacement therapy, which replaces the nicotine in your body. You still get nicotine but you do not use tobacco. Nicotine replacement products help you slowly reduce the amount of nicotine you need. These products come in several forms,   many of them available over-the-counter:  ?? Nicotine patches  ?? Nicotine gum and lozenges  ?? Nicotine inhaler  ?? Ask your doctor about bupropion (Wellbutrin) or varenicline (Chantix), which are prescription medicines. They do not contain nicotine. They help you by reducing withdrawal symptoms, such as stress and anxiety.  ?? Some people find hypnosis, acupuncture, and massage helpful for ending the smoking habit.  ?? Eat a healthy diet and get regular exercise. Having healthy habits will help your body move past its craving for nicotine.  ?? Be prepared to keep trying. Most people are not successful the first few times they try to quit. Do not get mad at yourself  if you smoke again. Make a list of things you learned and think about when you want to try again, such as next week, next month, or next year.   Where can you learn more?   Go to [email or web address] and sign in to your MyChart account. Enter [phone] in the Search Health Information box to learn more about ???Stopping Smoking: Care Instructions.???    If you do not have an account, please click on the ???Sign Up Now??? link.     ?? 2006-2015 Healthwise, Incorporated. Care instructions adapted under license by [facility]. This care instruction is for use with your licensed healthcare professional. If you have questions about a medical condition or this instruction, always ask your healthcare professional. Healthwise, Incorporated disclaims any warranty or liability for your use of this information.  Content Version: 10.6.465758; Current as of: July 11, 2013              Getting Back to Normal After Low Back Pain: Care Instructions  Your Care Instructions  Almost everyone has low back pain at some time. The good news is that most low back pain will go away in a few days or weeks with some basic self-care.  Some people are afraid that doing too much may make their pain worse. In the past, people stayed in bed, thinking this would help their backs. Now doctors think that, in most cases, getting back to your normal activities is good for your back, as long as you avoid doing things that make your pain worse.  Follow-up care is a key part of your treatment and safety. Be sure to make and go to all appointments, and call your doctor if you are having problems. It's also a good idea to know your test results and keep a list of the medicines you take.  How can you care for yourself at home?  Ease back into daily activities  ?? For the first day or two of pain, take it easy. But as soon as possible, get back to your normal daily life and activities.  ?? Get gentle exercise, such as walking. Movement keeps your  spine flexible and helps your muscles stay strong.  ?? If you are an athlete, return to your activity carefully. Choose a low-impact option until your pain is under control.  Avoid or change activities that cause pain  ?? Try to avoid too much bending, heavy lifting, or reaching. These movements put extra stress on your back.  ?? In bed, try lying on your side with a pillow between your knees. Or lie on your back on the floor with a pillow under your knees.  ?? When you sit, place a small pillow, a rolled-up towel, or a lumbar roll in the curve of your back for extra support.  ?? Try putting one  foot up on a stool or changing positions every few minutes if you have to stand still for a period of time.  Pay attention to body mechanics and posture  Body mechanics are the way you use your body. Posture is the way you sit or stand.  ?? Take extra care when you lift. When you must lift, bend your knees and keep your back straight. Avoid twisting, and keep the load close to your body.  ?? Stand or sit tall, with your shoulders back and your stomach pulled in to support your back.  Get support when you need it  ?? Let people know when you need a helping hand. Get family members or friends to help out with tasks you cannot do right now.  ?? Be honest with your doctor about how the pain affects you.  ?? If you have had to take time off work, talk to your doctor and boss about a gradual return-to-work plan. Find out if there are other ways you could do your job to avoid hurting your back again.  Reduce stress  Worrying about the pain can cause you to tense the muscles in your lower back. This in turn causes more pain. Here are a few things you can do to relax your mind and your muscles:  ?? Take 10 to 15 minutes to sit quietly and breathe deeply. Try to focus only on your breathing. If you cannot keep thoughts away, think about things that make you feel good.  ?? Get involved in your favorite hobby, or try something new.  ?? Talk to a  friend, read a book, or listen to your favorite music.  ?? Find a counselor you like and trust. Talk openly and honestly about your problems. Be willing to make some changes.  When should you call for help?  Call 911 anytime you think you may need emergency care. For example, call if:  ?? You are unable to move a leg at all.  Call your doctor now or seek immediate medical care if:  ?? You have new or worse symptoms in your legs, belly, or buttocks. Symptoms may include:  ?? Numbness or tingling.  ?? Weakness.  ?? Pain.  ?? You lose bladder or bowel control.  Watch closely for changes in your health, and be sure to contact your doctor if:  ?? You are not getting better as expected.   Where can you learn more?   Go to [email or web address] and sign in to your MyChart account. Enter [phone] in the Search Health Information box to learn more about ???Getting Back to Normal After Low Back Pain: Care Instructions.???    If you do not have an account, please click on the ???Sign Up Now??? link.     ?? 2006-2015 Healthwise, Incorporated. Care instructions adapted under license by [facility]. This care instruction is for use with your licensed healthcare professional. If you have questions about a medical condition or this instruction, always ask your healthcare professional. Healthwise, Incorporated disclaims any warranty or liability for your use of this information.  Content Version: 10.6.465758; Current as of: Mar 23, 2014

## 2014-11-23 NOTE — Progress Notes (Signed)
Subjective:  Jacob Sawyer is a 49 y.o. male with chief complaint of Back Pain      HPI:  HPI  C/o lumbar back pain. States he had MRI previously for back pain (In [place]) - states he is awaiting social security / disability. Has had lumbar back pain for years. Larey Seat out of a tree in 1990s. 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 Hasn't worked since 05/2014 due to pain. Pain occasionally shoots up to both shoulder blades. Also gets numbness/tingling down both legs (worse at night time). Has been on neurontin TID for 3 months. Pt doesn't feel that neurontin is helping, but they do make him a little sleepy. Has been sleeping on the couch for the past week. Pain currently 10/10, worse with bending forward, light chores. Pain improved with relaxation. Has tried motrin  2 tabs daily or has tried tylenol  2 tabs daily. Has tried heating pad which helps temporarily.     Carrying couch today, missed a step. Made chronic pain much worse.     Had to have Foley previously due to chronic bladder issues. Doesn't have foley now. Has f/u w/ urology in 2 weeks.     Moved here from [place] 2 months ago. Has records of previous MRI, workup of back pain. Will bring records next week.     Current Meds:  Current Outpatient Prescriptions   Medication Sig Dispense Refill   ??? tamsulosin (FLOMAX) 0.4 MG capsule Take 0.4 mg by mouth daily     ??? divalproex (DEPAKOTE) 500 MG DR tablet Take 500 mg by mouth 3 times daily     ??? nicotine (NICODERM CQ) 14 MG/24HR Place 1 patch onto the skin every 24 hours 42 patch 0   ??? nicotine polacrilex (NICORETTE) 2 MG gum Take 1 each by mouth as needed for Smoking cessation Chew 1 piece every 2-4 hours as needed for nicotine craving 160 each 3   ??? acetaminophen (APAP EXTRA STRENGTH) 500 MG tablet Take 2 tablets by mouth every 6 hours as needed for Pain 120 tablet 3   ??? ibuprofen (ADVIL) 200 MG tablet Take 1 tablet by mouth every 6 hours as needed for  Pain 120 tablet 3   ??? gabapentin (NEURONTIN) 600 MG tablet Take 1 tablet by mouth 3 times daily 90 tablet 2     No current facility-administered medications for this visit.       ROS:  Review of Systems   Constitutional: Positive for diaphoresis (night sweats 2-3 times/week). Negative for fever, chills and weight loss.   Respiratory: Negative for shortness of breath.    Cardiovascular: Negative for chest pain.   Gastrointestinal: Positive for abdominal pain (lower abd, daily). Negative for heartburn, nausea, vomiting, diarrhea and constipation.   Genitourinary: Negative for hematuria.        Difficulty starting stream   Musculoskeletal: Positive for back pain. Negative for neck pain.   Neurological: Positive for tingling. Negative for focal weakness.       Objective:  BP 109/72 mmHg   Pulse 91   Temp(Src) 98.6 ??F (37 ??C) (Oral)   Ht 6' 0.5" (1.842 m)   Wt 169 lb (76.658 kg)   BMI 22.59 kg/m2  Physical Exam   Constitutional: He is well-developed, well-nourished, and in no distress.   HENT:   Head: Normocephalic and atraumatic.   Cardiovascular: Normal rate, regular rhythm, normal heart sounds and intact distal pulses.  Exam reveals no  gallop and no friction rub.    No murmur heard.  Pulmonary/Chest: Effort normal and breath sounds normal. No respiratory distress. He has no wheezes. He has no rales.   Abdominal: Soft. Bowel sounds are normal. He exhibits no distension. There is no tenderness.   Musculoskeletal: Normal range of motion. He exhibits no edema.   5/5 bilateral upper/lower extremity strength, pain in lumbar region with straight leg raise   Neurological: He has normal reflexes. Gait normal.   Decreased touch sensation of L leg and R arm (chronic numbness in arm)   Skin: Skin is warm and dry.   Psychiatric: Affect normal.       Assessment:  1. Midline low back pain with sciatica, sciatica laterality unspecified    2. Tobacco abuse         Plan:    1. Midline low back pain with sciatica, sciatica laterality  unspecified  - acetaminophen (APAP EXTRA STRENGTH) 500 MG tablet; Take 2 tablets by mouth every 6 hours as needed for Pain  Dispense: 120 tablet; Refill: 3  - ibuprofen (ADVIL) 200 MG tablet; Take 1 tablet by mouth every 6 hours as needed for Pain  Dispense: 120 tablet; Refill: 3  - [facility]  - gabapentin (NEURONTIN) 600 MG tablet; Take 1 tablet by mouth 3 times daily  Dispense: 90 tablet; Refill: 2    2. Tobacco abuse  - nicotine (NICODERM CQ) 14 MG/24HR; Place 1 patch onto the skin every 24 hours  Dispense: 42 patch; Refill: 0  - nicotine polacrilex (NICORETTE) 2 MG gum; Take 1 each by mouth as needed for Smoking cessation Chew 1 piece every 2-4 hours as needed for nicotine craving  Dispense: 160 each; Refill: 3    Return in about 4 weeks (around 12/21/2014).

## 2014-11-26 NOTE — Progress Notes (Signed)
Attending Physician Statement  I have discussed the care of Jacob Sawyer, including pertinent history and exam findings,  with the resident. I have reviewed the key elements of all parts of the encounter with the resident.  I agree with the assessment, plan and orders as documented by the resident.  (GE Modifier)

## 2014-12-05 ENCOUNTER — Encounter: Attending: Nurse Practitioner

## 2014-12-05 ENCOUNTER — Ambulatory Visit: Admit: 2014-12-05 | Payer: MEDICAID | Attending: Adult Health

## 2014-12-05 DIAGNOSIS — G40909 Epilepsy, unspecified, not intractable, without status epilepticus: Secondary | ICD-10-CM

## 2014-12-05 NOTE — Progress Notes (Signed)
Jacob NoelJames Mastandrea is a 49 y.o. male here for acute visit. One week hx of diarrhea, vomiting, cold sweats, cough, sinus congestion. Last episode of vomiting 2 pm- did not tolerate water x 2 days. Diarrhea x 4-5 x today. Not dizzy. Cough with green sputum.Girlfriend is sick with similar symptoms.  Fever today- has taken tylenol and motrin    Last dose of depakote was yesterday- needs refills. States depakote started 12/28, has empty bottle from ED, states he has had several seizures since last visit, does not recall details, states girlfriend records them, entire body "shakes", + incontinent of urine. Pt knows no driving, ladders, tub bath.     Pt back pain from shoulders to legs, es tylenol or advil not helping. Remote injury - in body cast.  HPI    ROS    Filed Vitals:    12/05/14 1558   BP: 85/61   Pulse: 101   Temp: 100.5 F (38.1 C)       Physical Exam    Assessment    1. Seizure disorder (HCC)    2. Nausea and vomiting, unspecified intactability, vomiting of unspecified type    3. Diarrhea    4. Midline low back pain with sciatica, sciatica laterality unspecified    5. Hypotension, unspecified hypotension type        Plan   Staffed with Dr Leighton Parodyadwany- pt out of depakote, orthostatic +, no po x 2 days, no depakote since yesterday, multiple seizures in past month  Pt to ED, report called to phone center  RN attempted to put pt in w/c, he is now refusing to go, states he needs to go home to give his friend dinner and then will go to ED, pt advised he is at risk for seizure since he is out of medication, seizure could cause significant injury if fall occurs, depakote not refilled since dose or med may be changed tonight. Pt agrees to go tonight. Dr Leighton Parodyadwany aware      ICD-10-CM ICD-9-CM    1. Seizure disorder (HCC) G40.909 345.90    2. Nausea and vomiting, unspecified intactability, vomiting of unspecified type R11.2 787.01    3. Diarrhea R19.7 787.91    4. Midline low back pain with sciatica, sciatica laterality  unspecified M54.40 724.3    5. Hypotension, unspecified hypotension type I95.9 458.9        Electronically signed by Stoney Bangiane Delvin Hedeen, NP on 12/05/14 at 4:15 PM

## 2014-12-12 ENCOUNTER — Encounter: Attending: Adult Health

## 2014-12-12 MED ORDER — DIVALPROEX SODIUM 500 MG PO TBEC
500 MG | ORAL_TABLET | Freq: Three times a day (TID) | ORAL | Status: DC
Start: 2014-12-12 — End: 2015-01-04

## 2014-12-12 NOTE — Telephone Encounter (Signed)
Patient was instructed to go to ER at last visit for seizures. He refused and promised he would go later (which he didn't). I will refill for 1 month. He will need to come back here in the meantime to be checked. Thanks.

## 2014-12-14 ENCOUNTER — Inpatient Hospital Stay
Admit: 2014-12-14 | Discharge: 2014-12-14 | Disposition: A | Discharge status: Home / Self Care | Attending: Emergency Medicine

## 2014-12-14 LAB — PSA SCREENING: PSA: 1.09 ng/mL (ref <4.000)

## 2014-12-14 LAB — VALPROIC ACID LEVEL, TOTAL: Valproic Acid Lvl: 32 ug/mL — ABNORMAL LOW (ref 50–100)

## 2014-12-14 NOTE — ED Provider Notes (Signed)
PATIENT:          Jacob Sawyer, Jacob Sawyer          DOS:           12/14/2014  MR #:             [id number]             ACCOUNT #:     [id number]  DATE OF BIRTH:    1966-05-14              AGE:           49      HISTORY OF PRESENT ILLNESS:    PERTINENT HISTORY OF PRESENT  ILLNESS. patient seen and evaluated  Dr. Marin Comment.  Patient is a 49 year old male with a history of seizures.  Patient  reports that  he has 2-3 seizures a week on Depakote.  This presents by EMS after a  seizure  or patient fell onto a coffee table causing head trauma generalized  seizure  broke the coffee table.  Patient reports that he missed 3 days of  Depakote  secondary to the shortages at the pharmacy he received 2 doses today.  Patient  describes this seizure happened 1.5 hours ago.  Complains of pain in  her neck  and head is continuous aching in quality on the left side of his head  and into  both sides of his neck.  The patient rates as 10 out of 10 worse with  movement.   patient denies nausea or vomiting    PERTINENT PAST/ FAMILY/SOCIAL HISTORY past medical history of  seizures kidney  stones and a large prostate chronic obstructive pulmonary disease past  surgical  history of appendectomy.  Patient's doctor is Alyse Low from neurologist  Dr.  Dionne Milo patient takes 500 mg Depakote 3 times a day no known drug  allergies  smokes half a pack per day denies alcohol or drug abuse in the  family.        PHYSICAL EXAM vital signs as noted  General no distress patient received Versed by EMS  Head and normocephalic trauma to left side of head  Eyes pupils are equal, round and reactive to light and accommodation  extraocular motion intact  ENT moist mucous membranes  Neck tender to palpation over the spinous processes  heart regular rate and rhythm murmurs normal S1 and S2  Respiratory no distress clear to auscultation bilaterally  Abdomen soft nondistended tender bilateral lower quadrant  Genitourinary deferred  Back nontender  Extremities nontender  Skin  normal color  Neuro alert cranial nerves II through XII intact    MEDICAL DECISION MAKING:    SIGNIFICANT FINDINGS/ED COURSE/MEDICAL DECISION MAKING/TREATMENT  PLAN the  patient's CT for head and neck was negative for any acute process.  Patient was  given 1 g of Depakote instructions were undertaken.  C-collar was  removed.  The  patient has followup scheduled.    PROBLEM LIST:       Admit Reason:     Seizure: Entered Date: 14-Dec-2014 01:07, Entered By: Denton Ar, Status: Active        DIAGNOSIS breakthrough seizure  Concussion with loss of consciousness  Cervical strain        ADDITIONAL INFORMATION The Emergency Medicine attending physician  was present  in the Emergency Department, who reviewed case management, and  approved  evaluation/treatment.      COPIES SENT TO::     Sharolyn Douglas M(PCP): [id number]  Electronic Signatures:  LE, TONY (DO)  (Signed 02-Mar-2015 18:36)   Authored: MEDICAL DECISION MAKING   Co-Signer: HISTORY OF PRESENT ILLNESS, PHYSICAL EXAM, MEDICAL  DECISION MAKING,  PROBLEM LIST, DIAGNOSIS, Additional Infomation  Victorio Creeden Z (MD)  (Signed 14-Dec-2014 04:24)   Authored: HISTORY OF PRESENT ILLNESS, PHYSICAL EXAM, MEDICAL DECISION  MAKING,  PROBLEM LIST, DIAGNOSIS, Additional Infomation, Copies to be sent to:      Last Updated: 02-Mar-2015 18:36 by LE, TONY (DO)            Please see T-Sheet, initial assessment, and physician orders for  further details.    Dictating Physician: Jeni Salles, MD  Original Electronic Signature Date: 12/14/2014 01:39 A  MZT  Document #: [id number]    cc:  Sharolyn Douglas, MD

## 2014-12-19 NOTE — Telephone Encounter (Signed)
Patient has appointment 12/31/2014

## 2014-12-31 ENCOUNTER — Encounter: Attending: Internal Medicine

## 2015-01-04 ENCOUNTER — Ambulatory Visit: Admit: 2015-01-04 | Payer: MEDICAID | Attending: Internal Medicine

## 2015-01-04 DIAGNOSIS — G40909 Epilepsy, unspecified, not intractable, without status epilepticus: Secondary | ICD-10-CM

## 2015-01-04 MED ORDER — DIVALPROEX SODIUM 500 MG PO TBEC
500 MG | ORAL_TABLET | Freq: Three times a day (TID) | ORAL | Status: AC
Start: 2015-01-04 — End: ?

## 2015-01-04 NOTE — Progress Notes (Signed)
Subjective:  Jacob Sawyer is a 49 y.o. male with chief complaint of Medication Refill; and Diarrhea      HPI:  HPI     Back pain  States back pain is still the same, no change from last appt. Hasn't been able to go to  PT due to issues with urology, numerous appts. Has another appt in 4 days, plans to go to PT after urology issue is improved.     Seizures  Pt thinks he hasn't missed any doses of depakote, but has taken it later than when he is supposed to due to forgetting it and taking it as soon as he remembers. Denies any seizure like activity.     Tobacco use  Using nicotine patches couple times per week, but not really interested in quitting. He will smoke cigarettes when he doesn't have the patch on.     Current Meds:  Current Outpatient Prescriptions   Medication Sig Dispense Refill   ??? divalproex (DEPAKOTE) 500 MG DR tablet Take 1 tablet by mouth 3 times daily 90 tablet 5   ??? tamsulosin (FLOMAX) 0.4 MG capsule Take 0.4 mg by mouth daily     ??? nicotine (NICODERM CQ) 14 MG/24HR Place 1 patch onto the skin every 24 hours 42 patch 0   ??? nicotine polacrilex (NICORETTE) 2 MG gum Take 1 each by mouth as needed for Smoking cessation Chew 1 piece every 2-4 hours as needed for nicotine craving 160 each 3   ??? acetaminophen (APAP EXTRA STRENGTH) 500 MG tablet Take 2 tablets by mouth every 6 hours as needed for Pain 120 tablet 3   ??? ibuprofen (ADVIL) 200 MG tablet Take 1 tablet by mouth every 6 hours as needed for Pain 120 tablet 3   ??? gabapentin (NEURONTIN) 600 MG tablet Take 1 tablet by mouth 3 times daily 90 tablet 2     No current facility-administered medications for this visit.       ROS:  Review of Systems   Constitutional: Negative for fever, chills and diaphoresis.   Eyes: Negative for blurred vision and double vision.   Respiratory: Negative for shortness of breath.    Cardiovascular: Negative for chest pain.   Gastrointestinal: Negative for nausea and vomiting.   Genitourinary: Negative for dysuria, frequency  and hematuria.        Overflow incontinence, difficulty urinating   Musculoskeletal: Positive for back pain and joint pain (knees, ankles).   Neurological: Negative for dizziness, focal weakness, seizures, loss of consciousness and headaches.       Objective:  BP 90/68 mmHg   Pulse 57   Temp(Src) 98.1 ??F (36.7 ??C) (Oral)   Ht 6' (1.829 m)   Wt 163 lb (73.936 kg)   BMI 22.10 kg/m2  Physical Exam   Constitutional: He is well-developed, well-nourished, and in no distress.   HENT:   Head: Normocephalic and atraumatic.   Mouth/Throat: Oropharynx is clear and moist.   Eyes: Pupils are equal, round, and reactive to light.   Cardiovascular: Normal rate, regular rhythm, normal heart sounds and intact distal pulses.  Exam reveals no gallop and no friction rub.    No murmur heard.  Pulmonary/Chest: Effort normal and breath sounds normal. No respiratory distress. He has no wheezes. He has no rales.   Abdominal: Soft. Bowel sounds are normal. He exhibits no distension. There is no tenderness.   Musculoskeletal: He exhibits tenderness (lumbar spine). He exhibits no edema.   4/5 strength LLE due to pain,  3/5 strength RLE limited due to pain, lumbar back pain with straight leg raise bilaterally, 5/5 bilateral UE strength   Neurological: He has normal reflexes. Gait normal.   Skin: Skin is warm and dry.   Psychiatric: Affect normal.       Assessment:  1. Seizure disorder (HCC)    2. Tobacco abuse    3. Refused influenza vaccine         Plan:    1. Seizure disorder (HCC)  - divalproex (DEPAKOTE) 500 MG DR tablet; Take 1 tablet by mouth 3 times daily  Dispense: 90 tablet; Refill: 5    2. Tobacco abuse    3. Refused influenza vaccine    Return in about 2 months (around 03/06/2015).

## 2015-01-07 NOTE — Progress Notes (Signed)
Attending Physician Statement  I have discussed the care of Jacob Sawyer, including pertinent history and exam findings,  with the resident. I have reviewed the key elements of all parts of the encounter with the resident.  I agree with the assessment, plan and orders as documented by the resident.  (GE Modifier)

## 2015-01-10 NOTE — Discharge Summary (Signed)
PATIENT:             Jacob Sawyer, [name]         ADMISSION DATE:       10/25/2014  MEDICAL RECORD NUMBER:     [id number]            DISCHARGE DATE:       10/26/2014  ACCOUNT #:           [id number]           ADMITTING:            Ihor GullyMatthew   Alixandria Friedt, MD  DATE OF BIRTH:       07-Mar-1966             ATTENDING:            Ihor GullyMatthew   Witten Certain, MD  AGE:                 49                     HOSPITAL SERVICE:                                        Electronically Authenticated                            Ihor GullyMatthew Donyel Castagnola, MD 01/20/2015 09:04 P    ATTENDING PHYSICIAN:  Ihor GullyMatthew Christoper Bushey, MD    DICTATING PHYSICIAN:  Ihor GullyMatthew Parry Po, MD      ADMITTING PHYSICIAN:  Dr. Franco Colletebecca Holmes    Reason for Admission:  Seizure versus pseudo-seizure.    Allergies:  No known drug allergies.    Physical Examination:  Vital signs, pulse 67, temperature 98.4,  respiratory rate 18, blood pressure 114/56, saturation 98% on room  air.  Head, eyes, ears, nose, throat were normal.  The patient had a  small left 2 cm scalp abrasion.  No JVD.  Lungs:  Clear.  Heart:  Regular rate and rhythm.  No murmur.  Abdomen:  Soft and nontender.  Extremities:  No edema in extremities.    Consultations:  Neurology and Psychiatry.    Past Medical History:  Seizure versus pseudo-seizures, appendectomy,  tobacco abuse, alcohol abuse, marijuana abuse, and BPH.    Hospital Course:  The patient was brought to the emergency room by  paramedics with complaints of seizures.  The patient's head scan was  negative.  He had 2 episodes of witnessed seizures.  Given Dilantin  and Ativan.  The patient was then admitted to intensive care unit and  continued on Keppra.  Neurology was consulted.  The patient had a  witnessed seizure which she was actively banging his head on the side  of the bed rail.  The patient then started hallucinating and yelling  at security.  Given the atypical presentation of this seizures as  documented in the chart, there was suspicion for pseudo-seizure.   His  vital signs were stable throughout these events.  His antiepileptic  medications were discontinued and [facility]rows[facility]van.    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  2. ANEMIA, STABLE.  3. BENIGN PROSTATIC HYPERPLASIA.  4. CHRONIC OBSTRUCTIVE PULMONARY DISEASE AND ONGOING TOBACCO ABUSE.        SMOKING CESSATION WAS ADVISED.    Condition on Discharge:  Vital  signs stable.  Lungs clear.  The  patient is afebrile.    Disposition:  Discharge to [facility].    Diskriter Job ID: [id number]          DOD:01/10/2015 11:17 A  MC/dsk  DOT:01/10/2015 11:19 P  Job Number: [id number]  Document Number: [id number]  cc:   Ihor Gully, MD        [facility]        [address] [place] [address]

## 2015-02-04 ENCOUNTER — Encounter: Attending: Internal Medicine

## 2015-02-15 ENCOUNTER — Ambulatory Visit: Admit: 2015-02-15 | Payer: MEDICAID | Attending: Internal Medicine

## 2015-02-15 DIAGNOSIS — M5440 Lumbago with sciatica, unspecified side: Secondary | ICD-10-CM

## 2015-02-15 MED ORDER — BUDESONIDE-FORMOTEROL FUMARATE 80-4.5 MCG/ACT IN AERO
Freq: Two times a day (BID) | RESPIRATORY_TRACT | Status: DC
Start: 2015-02-15 — End: 2015-03-11

## 2015-02-15 MED ORDER — MUPIROCIN CALCIUM 2 % EX CREA
2 % | CUTANEOUS | Status: AC
Start: 2015-02-15 — End: 2015-03-17

## 2015-02-15 MED ORDER — ALBUTEROL SULFATE HFA 108 (90 BASE) MCG/ACT IN AERS
108 (90 Base) MCG/ACT | Freq: Four times a day (QID) | RESPIRATORY_TRACT | Status: AC | PRN
Start: 2015-02-15 — End: ?

## 2015-02-15 MED ORDER — SULFAMETHOXAZOLE-TRIMETHOPRIM 800-160 MG PO TABS
800-160 MG | ORAL_TABLET | Freq: Two times a day (BID) | ORAL | Status: AC
Start: 2015-02-15 — End: 2015-02-25

## 2015-02-15 NOTE — Progress Notes (Signed)
Subjective:  Jacob Sawyer is a 49 y.o. male with chief complaint of Medication Refill; Back Pain; and Depression      HPI:  HPI Comments: C/o swelling around finger nail, 2 months duration, worsening, thinks he might have injured his finger since the swelling, used a pin to get pus out of it a week ago, no similar swelling in the past.  States he sees Dr.Gangel for BPH.   For his back pain, he states he has not tried PT yet as he was told by his PCP that he will need to sort out his urology problems prior to starting PT.  Pt also states "I will probably need to go to ER if I dont get something for my pain"    Back Pain  This is a chronic problem. The current episode started more than 1 year ago. The problem occurs constantly. The problem has been gradually worsening since onset. The pain is present in the lumbar spine. Quality: throbbing. Radiates to: flanks. The symptoms are aggravated by bending and standing. Associated symptoms include numbness and tingling. Pertinent negatives include no abdominal pain, bladder incontinence, bowel incontinence, chest pain, dysuria, fever, headaches or weight loss. He has tried analgesics for the symptoms. The treatment provided no relief.       ROS:  Review of Systems   Constitutional: Negative for fever, chills and weight loss.   HENT: Negative for sore throat.    Eyes: Negative for blurred vision and double vision.   Respiratory: Negative for cough and shortness of breath.    Cardiovascular: Negative for chest pain, palpitations and leg swelling.   Gastrointestinal: Negative for nausea, vomiting, abdominal pain, diarrhea, constipation, blood in stool and bowel incontinence.   Genitourinary: Negative for bladder incontinence, dysuria, urgency, frequency and hematuria.   Musculoskeletal: Positive for back pain. Negative for falls and neck pain.   Skin: Negative for rash.   Neurological: Positive for tingling and numbness. Negative for dizziness, focal weakness, loss of  consciousness and headaches.   Psychiatric/Behavioral: Negative for depression and suicidal ideas.       Current Outpatient Prescriptions   Medication Sig Dispense Refill   ??? mupirocin (BACTROBAN) 2 % cream Apply 3 times daily. 1 Tube 0   ??? sulfamethoxazole-trimethoprim (BACTRIM DS) 800-160 MG per tablet Take 1 tablet by mouth 2 times daily for 10 days 14 tablet 0   ??? budesonide-formoterol (SYMBICORT) 80-4.5 MCG/ACT AERO Inhale 2 puffs into the lungs 2 times daily 1 Inhaler 3   ??? albuterol sulfate HFA (PROVENTIL HFA) 108 (90 BASE) MCG/ACT inhaler Inhale 2 puffs into the lungs every 6 hours as needed for Wheezing 1 Inhaler 3   ??? divalproex (DEPAKOTE) 500 MG DR tablet Take 1 tablet by mouth 3 times daily 90 tablet 5   ??? tamsulosin (FLOMAX) 0.4 MG capsule Take 0.4 mg by mouth daily     ??? nicotine (NICODERM CQ) 14 MG/24HR Place 1 patch onto the skin every 24 hours 42 patch 0   ??? acetaminophen (APAP EXTRA STRENGTH) 500 MG tablet Take 2 tablets by mouth every 6 hours as needed for Pain 120 tablet 3   ??? ibuprofen (ADVIL) 200 MG tablet Take 1 tablet by mouth every 6 hours as needed for Pain 120 tablet 3   ??? gabapentin (NEURONTIN) 600 MG tablet Take 1 tablet by mouth 3 times daily 90 tablet 2   ??? nicotine polacrilex (NICORETTE) 2 MG gum Take 1 each by mouth as needed for Smoking cessation Chew 1  piece every 2-4 hours as needed for nicotine craving 160 each 3     No current facility-administered medications for this visit.       Objective:  Filed Vitals:    02/15/15 1440   BP: 120/81   Pulse: 69   Temp: 98.9 ??F (37.2 ??C)   TempSrc: Oral   Height: 6' (1.829 m)   Weight: 158 lb (71.668 kg)     Physical Exam   Constitutional: He is oriented to person, place, and time and well-developed, well-nourished, and in no distress.   HENT:   Head: Normocephalic and atraumatic.   Mouth/Throat: Oropharynx is clear and moist.   Eyes: EOM are normal. Pupils are equal, round, and reactive to light.   Neck: Normal range of motion. Neck supple.    Cardiovascular: Normal rate, regular rhythm, normal heart sounds and intact distal pulses.    No murmur heard.  Pulmonary/Chest: Breath sounds normal. He has no wheezes. He has no rales.   Abdominal: Soft. Bowel sounds are normal. He exhibits no distension. There is no tenderness. There is no guarding.   Musculoskeletal: Normal range of motion. He exhibits no edema.   No midline/parapinal tenderness or muscle spasm, SLR test positive bilaterally, right index finger: poor nail hygiene, yellow colored, mild swelling with erythema, soft in consistency, TTP around cuticle, swelling around proximal fold is firm, no obvious skin breakdown   Neurological: He is alert and oriented to person, place, and time. GCS score is 15.   Skin: Skin is warm. No rash noted.   Psychiatric: Affect normal.       Assessment:  1. Bilateral low back pain with sciatica, sciatica laterality unspecified    2. Paronychia, right         Plan:  1. Bilateral low back pain with sciatica, sciatica laterality unspecified  External Referral to Physical Therapy  On review of previous visits, pt had imaging done at [facility] at past  Advised to bring records   Will hold off on any imaging for now  Will refer to PT  Tylenol prn for pain           2. Paronychia, right  mupirocin (BACTROBAN) 2 % cream    sulfamethoxazole-trimethoprim (BACTRIM DS) 800-160 MG per tablet    [facility] - Olam Idler, MD  Advised antibiotics, warm soaks, hygiene  Will refer to Derm for possible I &D.        Orders Placed This Encounter   Procedures   ??? [facility] - Olam Idler, MD   ??? External Referral to Physical Therapy       Orders Placed This Encounter   Medications   ??? mupirocin (BACTROBAN) 2 % cream     Sig: Apply 3 times daily.     Dispense:  1 Tube     Refill:  0   ??? sulfamethoxazole-trimethoprim (BACTRIM DS) 800-160 MG per tablet     Sig: Take 1 tablet by mouth 2 times daily for 10 days     Dispense:  14 tablet     Refill:  0   ???  budesonide-formoterol (SYMBICORT) 80-4.5 MCG/ACT AERO     Sig: Inhale 2 puffs into the lungs 2 times daily     Dispense:  1 Inhaler     Refill:  3   ??? albuterol sulfate HFA (PROVENTIL HFA) 108 (90 BASE) MCG/ACT inhaler     Sig: Inhale 2 puffs into the lungs every 6 hours as needed for Wheezing  Dispense:  1 Inhaler     Refill:  3       Medication indications, directions, and side effects were discussed.     Return in about 2 weeks (around 03/01/2015) for follow up.    Electronically signed by Orson Ape, MD on 02/15/15 at 3:09 PM

## 2015-02-15 NOTE — Patient Instructions (Signed)
Paronychia: Care Instructions  Your Care Instructions  Paronychia (say "pair-oh-NY-kee-uh") is an infection of the skin around a fingernail or toenail. It happens when germs enter through a break in the skin. The doctor may have made a small cut in the infected area to drain the pus.  Most cases of paronychia improve in a few days. But watch your symptoms and follow your doctor's advice. Though rare, a mild case can turn into something more serious and infect your entire finger or toe. Also, it is possible for an infection to return.  Follow-up care is a key part of your treatment and safety. Be sure to make and go to all appointments, and call your doctor if you are having problems. It's also a good idea to know your test results and keep a list of the medicines you take.  How can you care for yourself at home?  ?? If your doctor told you how to care for your infected nail, follow the doctor's instructions. If you did not get instructions, follow this general advice:  ?? Wash the area with clean water 2 times a day. Don't use hydrogen peroxide or alcohol, which can slow healing.  ?? You may cover the area with a thin layer of petroleum jelly, such as Vaseline, and a nonstick bandage.  ?? Apply more petroleum jelly and replace the bandage as needed.  ?? If your doctor prescribed antibiotics, take them as directed. Do not stop taking them just because you feel better. You need to take the full course of antibiotics.  ?? Take an over-the-counter pain medicine, such as acetaminophen (Tylenol), ibuprofen (Advil, Motrin), or naproxen (Aleve). Read and follow all instructions on the label.  ?? Do not take two or more pain medicines at the same time unless the doctor told you to. Many pain medicines have acetaminophen, which is Tylenol. Too much acetaminophen (Tylenol) can be harmful.  ?? Prop up the toe or finger so that it is higher than the level of your heart. This will help with pain and swelling.  ?? Apply heat. Put a warm  water bottle, heating pad set on low, or warm cloth on your finger or toe. Do not go to sleep with a heating pad on your skin.  ?? Soak the area in warm water twice a day for 15 minutes each time. After soaking, dry the area well and apply a thin layer of petroleum jelly, such as Vaseline. Put on a new bandage.  When should you call for help?  Call your doctor now or seek immediate medical care if:  ?? You have signs of new or worsening infection, such as:  ?? Increased pain, swelling, warmth, or redness.  ?? Red streaks leading from the infected skin.  ?? Pus draining from the area.  ?? A fever.  Watch closely for changes in your health, and be sure to contact your doctor if:  ?? You develop joint aches with your infection.  ?? Your infection comes back.  ?? You do not get better as expected.   Where can you learn more?   Go to [email or web address] and sign in to your MyChart account. Enter C435 in the Search Health Information box to learn more about ???Paronychia: Care Instructions.???    If you do not have an account, please click on the ???Sign Up Now??? link.     ?? 2006-2015 [employer]. Care instructions adapted under license by [facility]. This care instruction is for use with your licensed   healthcare professional. If you have questions about a medical condition or this instruction, always ask your healthcare professional. [employer], Inc[employer] disclaims any warranty or liability for your use of this information.  Content Version: 10.6.465758; Current as of: December 22, 2013

## 2015-02-19 NOTE — Progress Notes (Signed)
Attending Physician Statement  I have discussed the care of Jacob Sawyer, including pertinent history and exam findings,  with the resident. I have reviewed the key elements of all parts of the encounter with the resident.  I agree with the assessment, plan and orders as documented by the resident.  (GE Modifier)

## 2015-03-05 ENCOUNTER — Ambulatory Visit: Admit: 2015-03-05 | Discharge: 2015-03-05 | Payer: MEDICAID | Attending: Internal Medicine

## 2015-03-05 ENCOUNTER — Telehealth

## 2015-03-05 DIAGNOSIS — IMO0002 Reserved for concepts with insufficient information to code with codable children: Secondary | ICD-10-CM

## 2015-03-05 MED ORDER — GABAPENTIN 600 MG PO TABS
600 MG | ORAL_TABLET | Freq: Three times a day (TID) | ORAL | Status: AC
Start: 2015-03-05 — End: ?

## 2015-03-05 NOTE — Progress Notes (Signed)
Subjective:  Jacob Sawyer is a 49 y.o. male with chief complaint of Medication Refill; and Other      HPI:  HPI     BPH  Pt states he sees urologist today for cystoscopy. Pt able to urinate, no dysuria/hematuria. Occasionally leaks urine.     SOB/DOE  Using proair daily. States he doesn't have symbicort b/c he was told at pharmacy that he needed PA. Not interested in quitting smoking at this time but does admit that smoking probably makes him more SOB.      Paronychia  Pt states R  finger is worsening, has appt with derm but unable to get in until September. Pt states finger nail started falling off 3-4 mos ago. C/o pus occasionally draining from finger. Finished course of Bactrim yesterday, doesn't feel that finger has improved at all. Actually feels finger has worsened. Soaks finger twice per day, puts bandage on finger at night time with antibiotic cream. Pt denies swelling, erythema of entire finger (only near cuticle)    Back pain  Pt states he will call PT this week to get setup for first appt. Pt states he wanted to get urology problem taken care of first. Has disability paperwork he would like filled out.     Current Meds:  Current Outpatient Prescriptions   Medication Sig Dispense Refill   ??? gabapentin (NEURONTIN) 600 MG tablet Take 1 tablet by mouth 3 times daily 90 tablet 2   ??? mupirocin (BACTROBAN) 2 % cream Apply 3 times daily. 1 Tube 0   ??? albuterol sulfate HFA (PROVENTIL HFA) 108 (90 BASE) MCG/ACT inhaler Inhale 2 puffs into the lungs every 6 hours as needed for Wheezing 1 Inhaler 3   ??? divalproex (DEPAKOTE) 500 MG DR tablet Take 1 tablet by mouth 3 times daily 90 tablet 5   ??? tamsulosin (FLOMAX) 0.4 MG capsule Take 0.4 mg by mouth daily     ??? acetaminophen (APAP EXTRA STRENGTH) 500 MG tablet Take 2 tablets by mouth every 6 hours as needed for Pain 120 tablet 3   ??? ibuprofen (ADVIL) 200 MG tablet Take 1 tablet by mouth every 6 hours as needed for Pain 120 tablet 3   ??? budesonide-formoterol  (SYMBICORT) 80-4.5 MCG/ACT AERO Inhale 2 puffs into the lungs 2 times daily 1 Inhaler 3     No current facility-administered medications for this visit.       ROS:  Review of Systems   Constitutional: Negative for fever, chills and weight loss.   Respiratory: Positive for shortness of breath (DOE).    Cardiovascular: Negative for chest pain.   Gastrointestinal: Negative for nausea, vomiting, abdominal pain and diarrhea.   Genitourinary: Positive for urgency. Negative for dysuria and hematuria.   Neurological: Positive for dizziness (lightheaded upon standing). Negative for seizures and loss of consciousness.       Objective:  BP 100/67 mmHg   Pulse 73   Temp(Src) 97.8 ??F (36.6 ??C) (Oral)   Ht 6' (1.829 m)   Wt 157 lb (71.215 kg)   BMI 21.29 kg/m2  Physical Exam   Constitutional: He is well-developed, well-nourished, and in no distress.   HENT:   Head: Normocephalic and atraumatic.   Mouth/Throat: Oropharynx is clear and moist.   Eyes: Pupils are equal, round, and reactive to light.   Cardiovascular: Normal rate, regular rhythm and normal heart sounds.  Exam reveals no gallop and no friction rub.    No murmur heard.  Pulmonary/Chest: Effort normal and breath  sounds normal. No respiratory distress. He has no wheezes. He has no rales.   Abdominal: Soft. Bowel sounds are normal. He exhibits no distension. There is no tenderness.   Skin:   Erythema of cuticle of R 2nd phalanx, swelling present, unable to express drainage, half of nail is cracked and pitted. All fingernails have dirt surrounding them   Psychiatric: Affect normal.       Assessment:  1. Paronychia, unspecified laterality    2. Midline low back pain with sciatica, sciatica laterality unspecified    3. Positional lightheadedness    4. Tobacco abuse         Plan:    1. Paronychia, unspecified laterality  - [facility] - Lindell NoeMichael Cullado, MD    2. Midline low back pain with sciatica, sciatica laterality unspecified  - gabapentin (NEURONTIN) 600  MG tablet; Take 1 tablet by mouth 3 times daily  Dispense: 90 tablet; Refill: 2    3. Positional lightheadedness  - Advised pt to discuss Flomax with his urologist as it may be causing this issue. Advised pt to get up slowly, stand for a few seconds before walking and always use handles/handrails. He was instructed to sit back down if he felt dizzy upon standing.    4. Tobacco abuse  - pt not interested in quitting at this time. Pt counseled that breathing will improve if he quits smoking  Return in about 3 months (around 06/05/2015).

## 2015-03-05 NOTE — Progress Notes (Signed)
Attending Physician Statement  I have discussed the care of Jacob Sawyer, including pertinent history and exam findings,  with the resident. I have reviewed the key elements of all parts of the encounter with the resident.  I agree with the assessment, plan and orders as documented by the resident.  (GE Modifier)

## 2015-03-05 NOTE — Telephone Encounter (Signed)
Call from [facility] they do not do not accept any referrals for hands.

## 2015-03-06 MED ORDER — CEPHALEXIN 500 MG PO CAPS
500 MG | ORAL_CAPSULE | Freq: Two times a day (BID) | ORAL | Status: AC
Start: 2015-03-06 — End: ?

## 2015-03-06 NOTE — Telephone Encounter (Signed)
Staffed with Dr Gaylyn RongHa. Pt will need to keep dermatology appt. Sent in rx for Keflex

## 2015-03-08 ENCOUNTER — Telehealth

## 2015-03-08 NOTE — Telephone Encounter (Addendum)
Prior auth for symbicort requested. I called [facility] and step therapy they require before they will approve Symbicort would be albuterol (which patient is on) and a corticosteroid, q-var, pulmicort and asmanex are all covered medications.

## 2015-03-11 ENCOUNTER — Telehealth

## 2015-03-11 MED ORDER — BUDESONIDE 90 MCG/ACT IN AEPB
90 MCG/ACT | Freq: Two times a day (BID) | RESPIRATORY_TRACT | Status: AC
Start: 2015-03-11 — End: ?

## 2015-03-11 NOTE — Telephone Encounter (Signed)
Pt informed. Sent in new rx for Pulmicort. Per pt, last had PFTs within the past year in [place] and was diagnosed with COPD at that time and was placed on symbicort and albuterol. Hasn't tried any other inhalers.

## 2015-03-11 NOTE — Telephone Encounter (Signed)
Called pt to inform him that he will need functional capacity evaluation. [facility]. PT referral faxed. Will finish disability paperwork after evaluation is completed.

## 2015-03-12 NOTE — Telephone Encounter (Signed)
Changed referral to [facility] for FCE since [facility] does not perform them.

## 2015-03-19 ENCOUNTER — Encounter: Attending: Internal Medicine

## 2015-03-21 NOTE — ED Provider Notes (Signed)
PATIENT:          Jacob Sawyer, Omarii          DOS:           03/21/2015  MR #:             [id number]             ACCOUNT #:     [id number]  DATE OF BIRTH:    1965/11/08              AGE:           49      HISTORY OF PRESENT ILLNESS:    PERTINENT HISTORY OF PRESENT  ILLNESS. Patient was evaluated in  collaboration  with Dr. Alan RipperSchwiger.  Chief complaint is left knee pain.  He lost his  footing on  the steps and fell down several steps.  He hurt his left knee.  He  denies  hitting his head or loss of consciousness.    PERTINENT PAST/ FAMILY/SOCIAL HISTORY Chronic back pain        PHYSICAL EXAM He is alert and oriented with normal vital signs.  He  has a  contusion to his left medial knee.  MSPs intact.  Extensor mechanism  is intact.   He has mild paralumbar tenderness palpation as well.  Neurological  exam is  grossly intact.    MEDICAL DECISION MAKING:    SIGNIFICANT FINDINGS/ED COURSE/MEDICAL DECISION MAKING/TREATMENT  PLAN Left  knee x-ray shows no acute bony injury pattern.  He was given 1  Percocet for  pain.  He is discharged with #6 Percocet, Naprosyn and an Ace wrap.  Results discussed in layman's terms to patient satisfaction.  All  questions  answered in layman's terms.  Pt understands importance of followup  care as  directed.  The patient has been instructed to return to ED to if new  symptoms,  problems, or questions.  We mutually agreed with the plan of  disposition.    PROBLEM LIST:       ED Diagnosis:     Contusion of left knee, initial encounter (S80.02XA): Entered  Date:  21-Mar-2015 21:56, Entered By: Johnnette LitterHORNYAK, Nabil Bubolz, Status: Active, ICD-10:  V40.98JX:  S80.02XA        ADDITIONAL INFORMATION If the physician assistant/nurse practitioner  was  involved in patient care, I personally performed and participated in  all the  above services (including HPI and PE). I have reviewed with the  physician  assistant/nurse practitioner the history and confirmed the findings  with the  patient. I personally performed all  surgical procedures in the medical  record  unless otherwise indicated.      COPIES SENT TO::     Marliss CzarCRUM, CHRISTINA M(PCP): [phone]    Electronic Signatures:  Olympia Adelsberger (CNP)  (Signed 21-Mar-2015 21:57)   Authored: HISTORY OF PRESENT ILLNESS, PHYSICAL EXAM, MEDICAL DECISION  MAKING,  PROBLEM LIST, Additional Infomation, Copies to be sent to:  SCHWIGER, ERIC (DO)  (Signed 22-Mar-2015 02:40)   Authored: HISTORY OF PRESENT ILLNESS   Co-Signer: HISTORY OF PRESENT ILLNESS, PHYSICAL EXAM, MEDICAL  DECISION MAKING,  PROBLEM LIST, Additional Infomation, Copies to be sent to:      Last Updated: 22-Mar-2015 02:40 by SCHWIGER, ERIC (DO)            Please see T-Sheet, initial assessment, and physician orders for  further details.    Dictating Physician: Johnnette LitterSusan Ailis Rigaud, CNP  Original Electronic Signature Date: 03/21/2015 09:57 P  SH  Document #: [id number]    cc:  Marliss Czarhristina Crum, MD

## 2015-04-16 NOTE — Telephone Encounter (Signed)
Noted  

## 2015-04-16 NOTE — Telephone Encounter (Signed)
FYI Dr Ricki Miller:    You saw Mr Jacob Sawyer in April for back pain. He requested me to fill out disability paperwork, however I feel he needs further evaluation before we can fill out that paperwork. I ordered a functional capacity evaluation in May and have not received results of that yet. He no-showed to his follow up visit with me, so I am not sure if he has even had this evaluation done yet. I left a message for him to call our office. I spoke with Dr Roseanne Reno and she recommended that the patient come in for an office visit (for disability paperwork) after the evaluation is completed. Since you have seen him before, I think it would be best for him to see you again. I am leaving the disability paperwork in your "Pending action" folder in the [facility]. Thanks!

## 2015-04-16 NOTE — Telephone Encounter (Signed)
Called pt. Left VM. Still awaiting functional capacity evaluation. Can't complete disability paperwork until we get results of the evaluation.

## 2015-04-25 NOTE — Telephone Encounter (Signed)
Covering for Dr. Talbert Nan    I called [facility] at [phone] at 17:45 and spoke with the pharmacist ([place]) regarding this issue.    Per the pharmacist, [place], the medication robot dispensed the 800mg  tabs instead of the 600mg  tabs. She personally corrected this issue and took the correct prescription of 600mg  tablets to the patient's house. She notified the patient regarding the issue and told him she would inform us at the [facility].    I called to inform the patient we are aware of the situation. No answer. Left VM to call back to [facility]. Please ask him to contact the [facility] or call 911 if he feels he is having any new or concerning symptoms (although would not be expected necessarily with the small dose increase he experienced for one day). Than, please close encounter.    Thanks!

## 2015-04-25 NOTE — Telephone Encounter (Signed)
[place] at [facility] at [phone] calling to let Dr. know that Pt. took 3 doses of Gabapentin 800 mg instead of 600 mg.  It has been corrected.

## 2015-04-26 NOTE — Telephone Encounter (Signed)
Left cb message. I tried to call other number listed and mailbox is full.

## 2015-04-30 NOTE — Telephone Encounter (Signed)
Attempted to call patient. Busy signal.

## 2015-05-02 NOTE — Telephone Encounter (Signed)
Call back letter mailed to patient.

## 2015-05-02 NOTE — Telephone Encounter (Signed)
Patient's line busy.

## 2015-07-23 ENCOUNTER — Encounter: Attending: Dermatology

## 2020-10-03 IMAGING — MR MRI LUMBAR SPINE WITHOUT AND WITH CONTRAST
9 series · 48 of 48 positions shown · IV contrast (gadavist)
Comparison: None available.

﻿EXAM:  MRI LUMBAR SPINE WITHOUT AND WITH CONTRAST
INDICATION: Lower back pain with bilateral lower extremity numbness. History of 2 prior back surgeries.
TECHNIQUE: Multiplanar multisequential MRI of the lumbar spine was performed without and with 5 mL of Gadavist.

[Series 5: T2 · sagittal · 4.0mm · 1.01mm/px · 5 of 13 slices shown (1 of 3)]
[im 1/13]
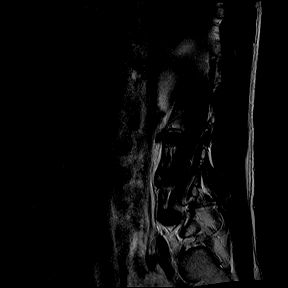
[im 4/13]
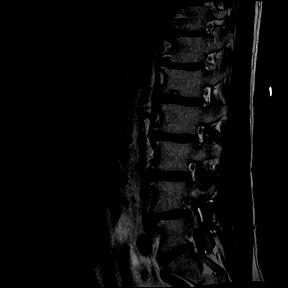
[im 7/13]
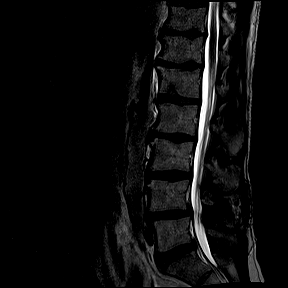
[im 10/13]
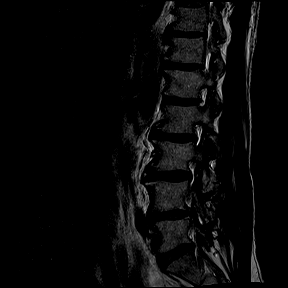
[im 13/13]
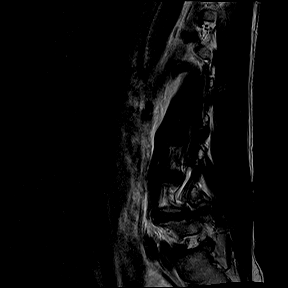

[Series 6: T1 · sagittal · 4.0mm · 1.01mm/px · 4 of 13 slices shown]
[im 1/13]
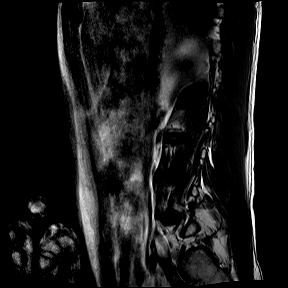
[im 5/13]
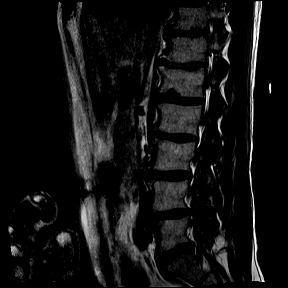
[im 9/13]
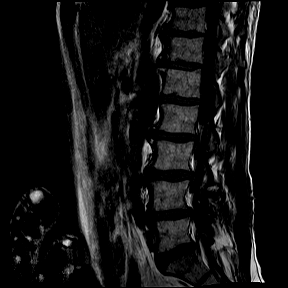
[im 13/13]
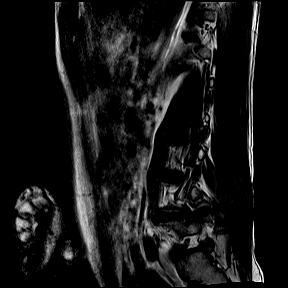

[Series 8: STIR · sagittal · 4.0mm · 1.13mm/px · 4 of 13 slices shown]
[im 1/13]
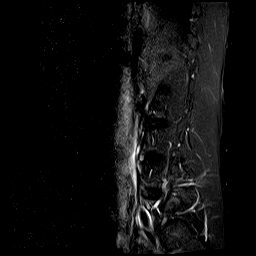
[im 5/13]
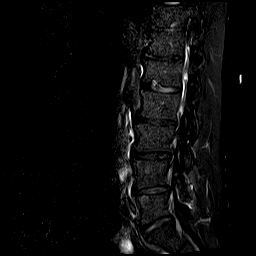
[im 9/13]
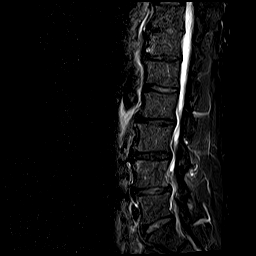
[im 13/13]
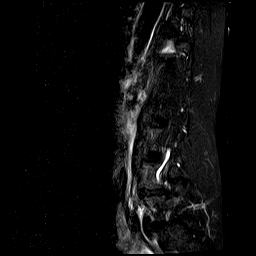

[Series 9: T1 fat-sat · sagittal · 4.0mm · 1.13mm/px · 4 of 13 slices shown (1 of 2)]
[im 1/13]
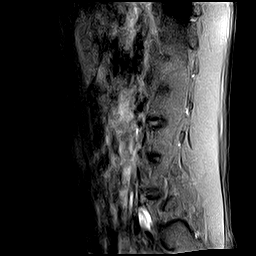
[im 5/13]
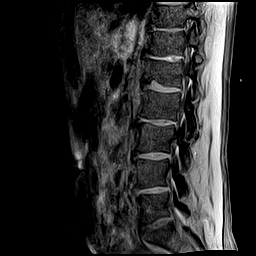
[im 9/13]
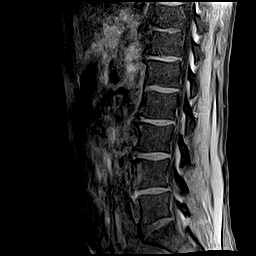
[im 13/13]
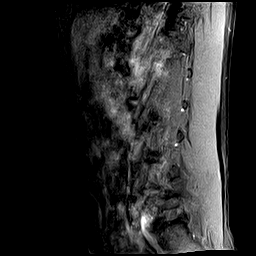

[Series 10: T2 · axial · 4.0mm · 0.47mm/px · z∈[-168,+37]mm · 7 of 23 slices shown (2 of 3)]
[im 1/23]
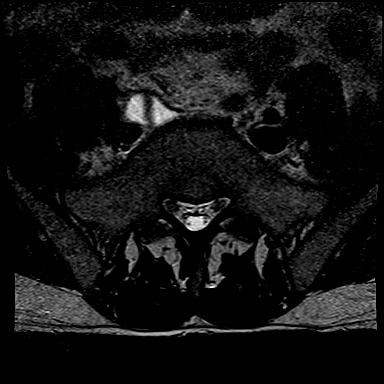
[im 4/23]
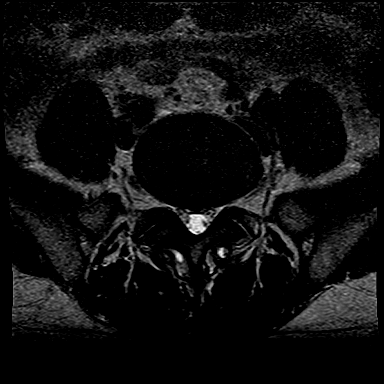
[im 8/23]
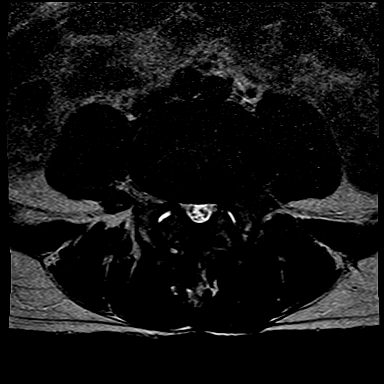
[im 12/23]
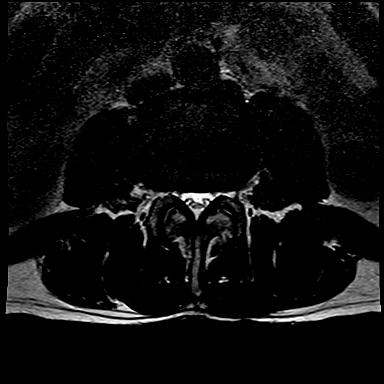
[im 15/23]
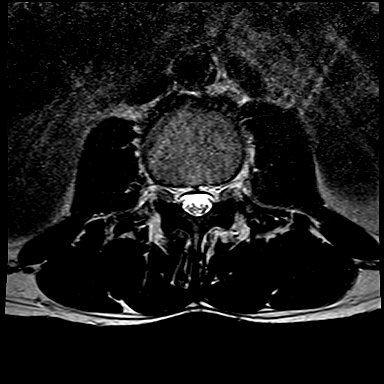
[im 19/23]
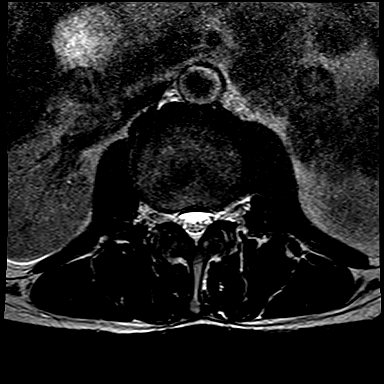
[im 23/23]
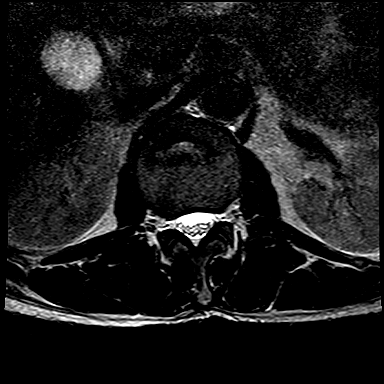

[Series 11: T1 fat-sat · axial · 4.0mm · 0.70mm/px · z∈[-168,+37]mm · 7 of 23 slices shown (2 of 2)]
[im 1/23]
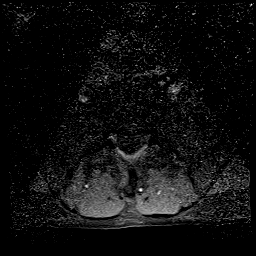
[im 4/23]
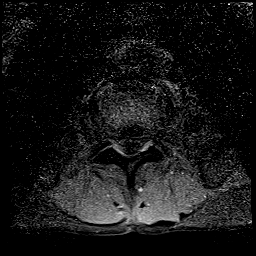
[im 8/23]
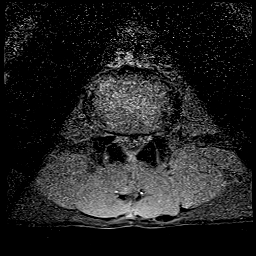
[im 12/23]
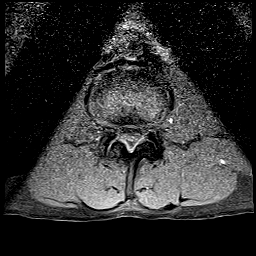
[im 15/23]
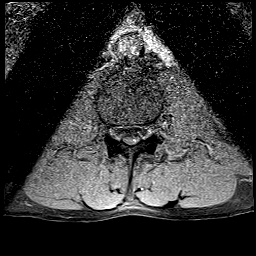
[im 19/23]
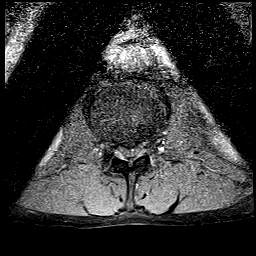
[im 23/23]
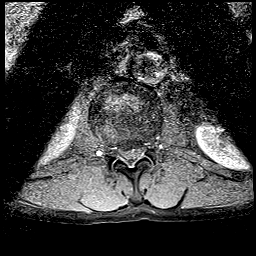

[Series 12: T2 · coronal · 7.0mm · 1.25mm/px · 6 of 20 slices shown (3 of 3)]
[im 1/20]
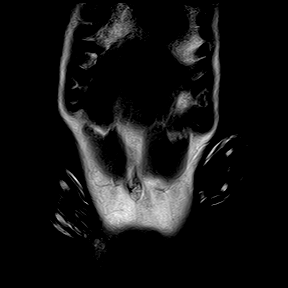
[im 4/20]
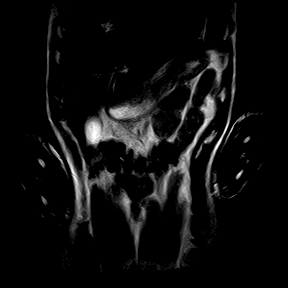
[im 8/20]
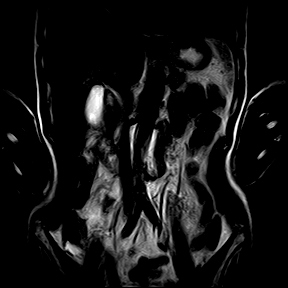
[im 12/20]
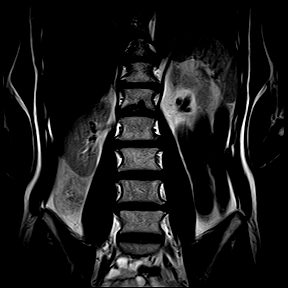
[im 16/20]
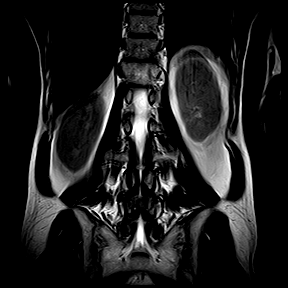
[im 20/20]
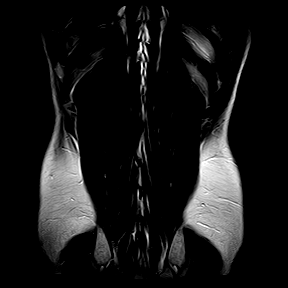

[Series 13: T1 fat-sat post-contrast · sagittal · 4.0mm · 1.13mm/px · 4 of 13 slices shown (1 of 2)]
[im 1/13]
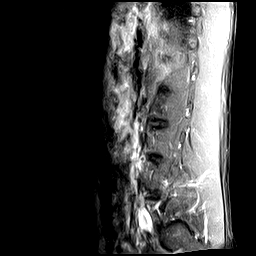
[im 5/13]
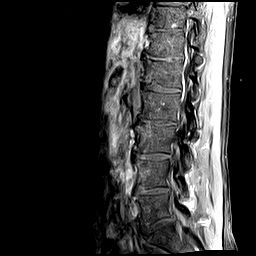
[im 9/13]
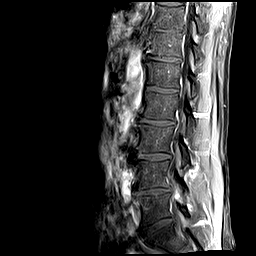
[im 13/13]
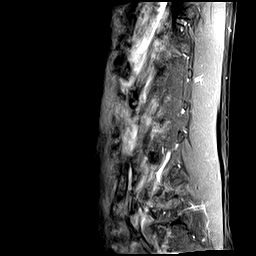

[Series 14: T1 fat-sat post-contrast · axial · 4.0mm · 0.70mm/px · z∈[-168,+37]mm · 7 of 23 slices shown (2 of 2)]
[im 1/23]
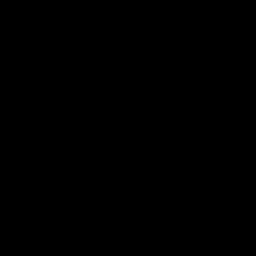
[im 4/23]
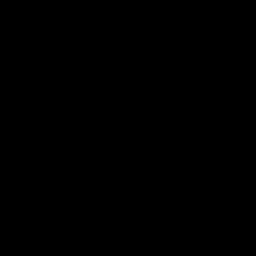
[im 8/23]
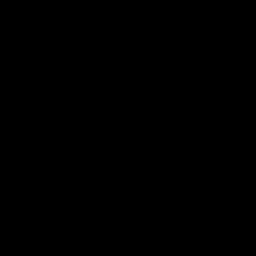
[im 12/23]
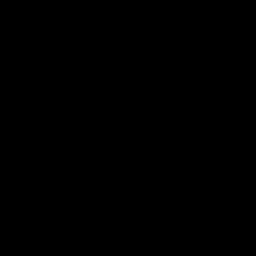
[im 15/23]
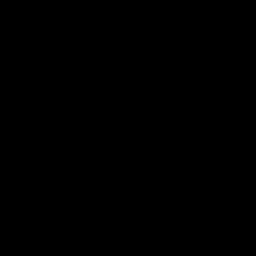
[im 19/23]
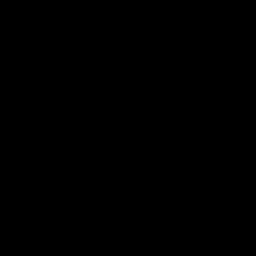
[im 23/23]
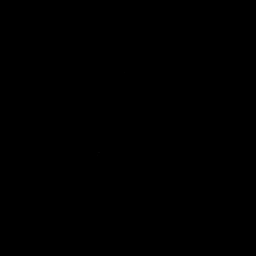

[48 of 48 positions shown; findings below may reference images not displayed]

FINDINGS: Bone marrow signal intensity is normal. There is no acute fracture or subluxation. Distal spinal cord is normal in signal intensity and terminates normally at T12-L1 disc space level. Spinal canal is congenitally narrow.

L1-2 and L2-3 levels are unremarkable.

At L3-4 level, there is mild-to-moderate bilateral neural foraminal stenosis from facet arthropathy and bulging annulus without nerve root impingement.

At L4-5 level, there is minimal anterolisthesis of L4 on L5 vertebral body. A laminectomy defect is also identified. There is a small broad-based central disc bulge, mildly effacing the ventral thecal sac. There is severe left and moderate to severe right neural foraminal stenosis from facet arthropathy and bulging annulus.

At L5-S1 level, there is a small broad-based central disc bulge without mass effect on the thecal sac. There is no significant neural foraminal stenosis.

Following intravenous contrast administration, there is no abnormal nerve root or paraspinal soft tissue enhancement.
IMPRESSION: 1. Minimal anterolisthesis of L4 on L5 vertebral body and a laminectomy defect at L4-5 level. 

2. No significant disc herniation or spinal stenosis at any level. 

3. Multilevel neural foraminal stenosis as detailed above.

## 2021-10-14 IMAGING — MR MRI LUMBAR SPINE WITHOUT AND WITH CONTRAST
9 series · 48 of 48 positions shown · IV contrast (gadavist)
Comparison: Lumbar spine MRI examination dated 10/03/2020.

﻿EXAM:  [id number]   MRI LUMBAR SPINE WITHOUT AND WITH CONTRAST
INDICATION: 55-year-old with history of low back pain and lower extremity radiculopathy on the right side.  Two prior back surgeries.
TECHNIQUE: Multiplanar, multisequential MRI of the lumbar spine was performed without and with 5 mL Gadavist intravenous contrast.

[Series 8: T2 · sagittal · 4.0mm · 1.01mm/px · 3 of 13 slices shown (1 of 3)]
[im 1/13]
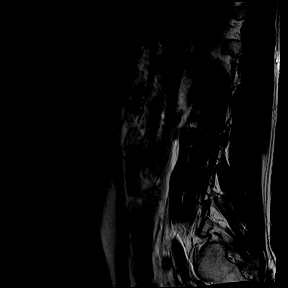
[im 7/13]
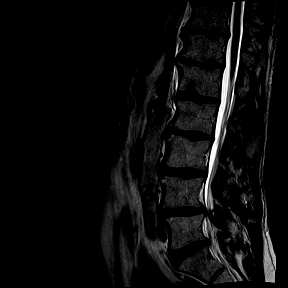
[im 13/13]
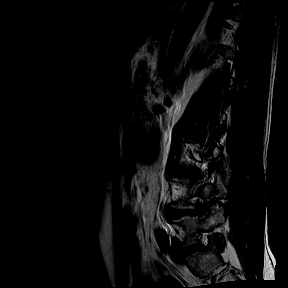

[Series 9: T1 · sagittal · 4.0mm · 1.01mm/px · 3 of 13 slices shown]
[im 1/13]
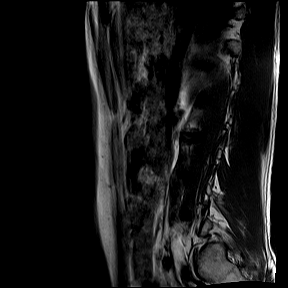
[im 7/13]
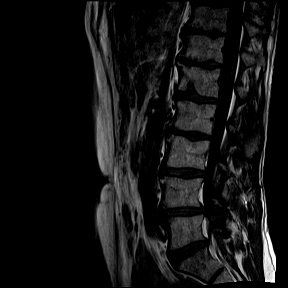
[im 13/13]
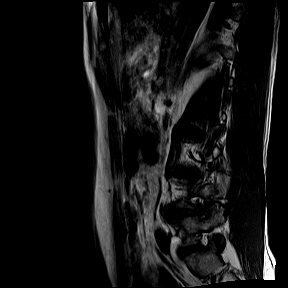

[Series 10: T1 fat-sat · sagittal · 4.0mm · 1.13mm/px · 4 of 13 slices shown (1 of 2)]
[im 1/13]
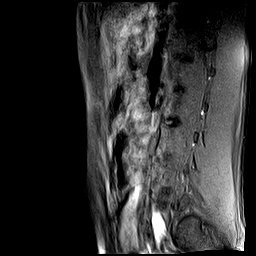
[im 5/13]
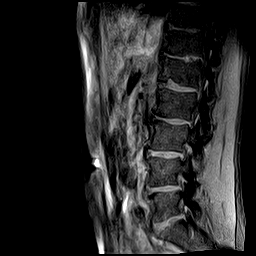
[im 9/13]
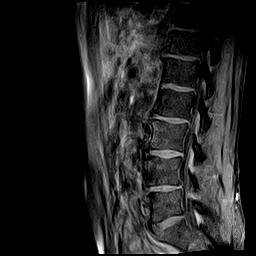
[im 13/13]
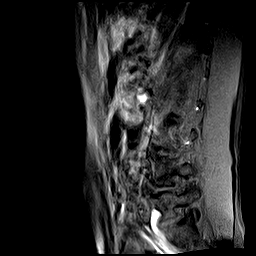

[Series 11: STIR · sagittal · 4.0mm · 1.13mm/px · 4 of 13 slices shown]
[im 1/13]
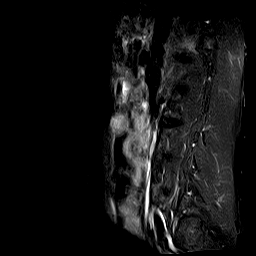
[im 5/13]
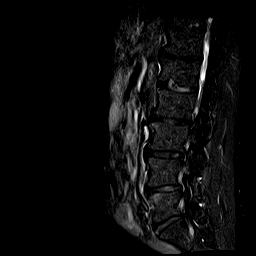
[im 9/13]
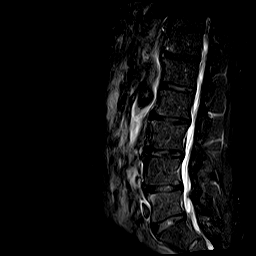
[im 13/13]
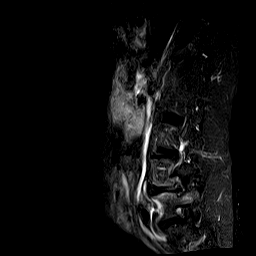

[Series 12: T2 · axial · 4.0mm · 0.47mm/px · z∈[-166,+37]mm · 8 of 25 slices shown (2 of 3)]
[im 1/25]
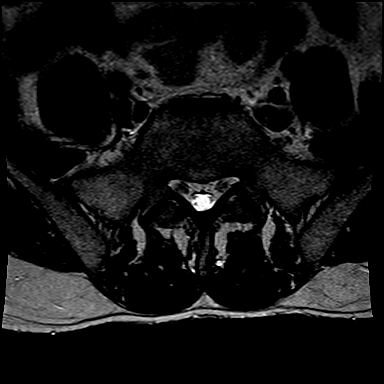
[im 4/25]
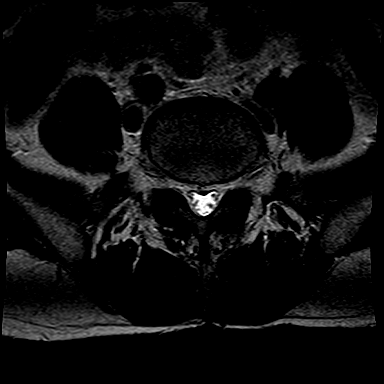
[im 7/25]
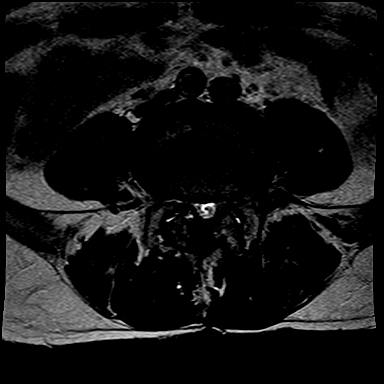
[im 11/25]
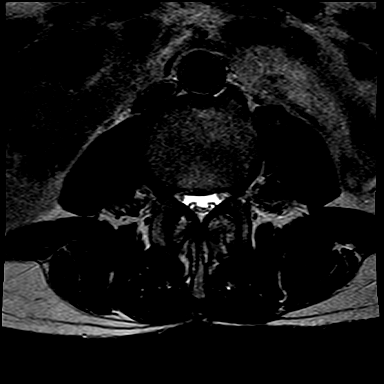
[im 14/25]
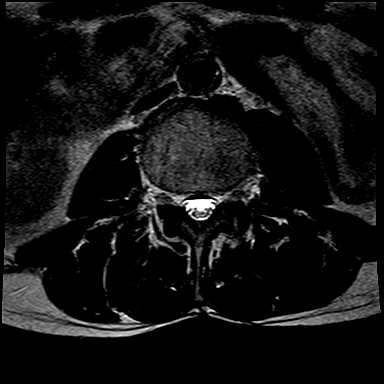
[im 18/25]
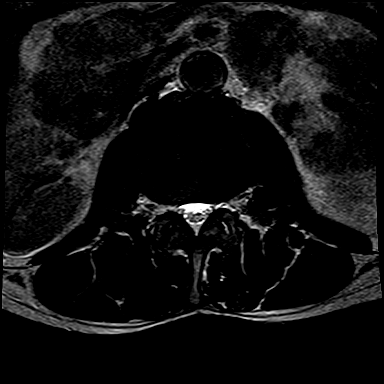
[im 21/25]
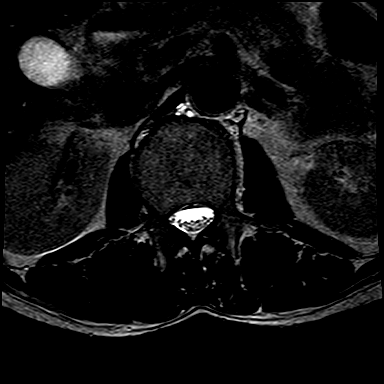
[im 25/25]
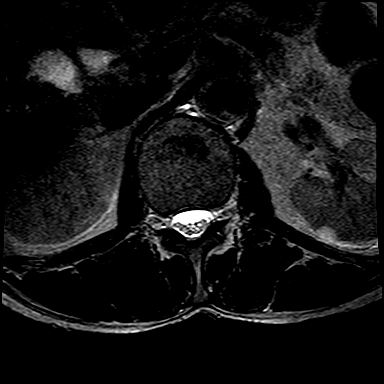

[Series 13: T1 fat-sat · axial · 4.0mm · 0.70mm/px · z∈[-166,+37]mm · 8 of 25 slices shown (2 of 2)]
[im 1/25]
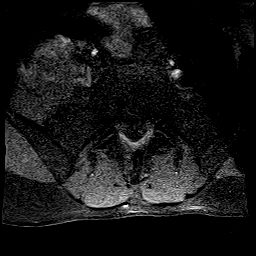
[im 4/25]
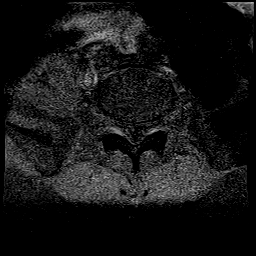
[im 7/25]
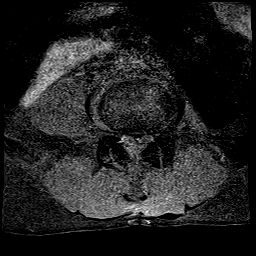
[im 11/25]
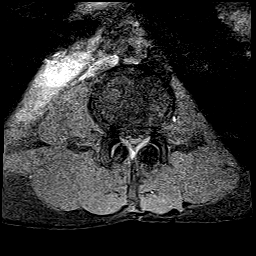
[im 14/25]
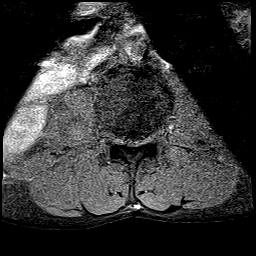
[im 18/25]
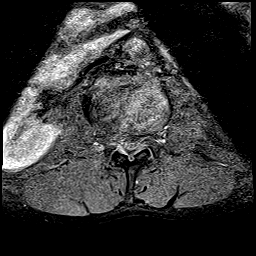
[im 21/25]
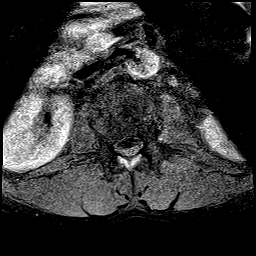
[im 25/25]
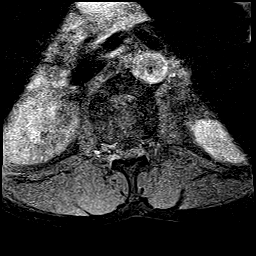

[Series 14: T1 fat-sat post-contrast · sagittal · 4.0mm · 1.13mm/px · 4 of 13 slices shown (1 of 2)]
[im 1/13]
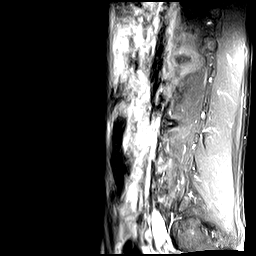
[im 5/13]
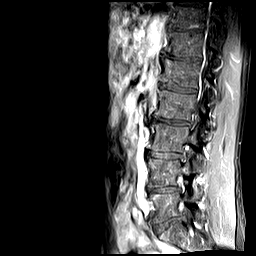
[im 9/13]
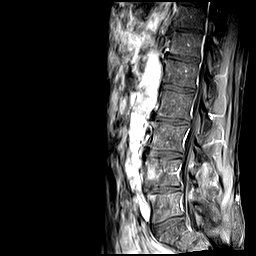
[im 13/13]
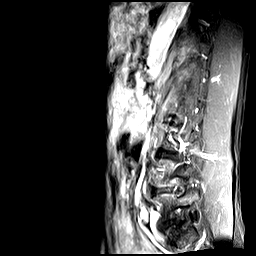

[Series 15: T1 fat-sat post-contrast · axial · 4.0mm · 0.70mm/px · z∈[-166,+37]mm · 8 of 25 slices shown (2 of 2)]
[im 1/25]
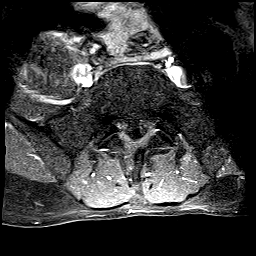
[im 4/25]
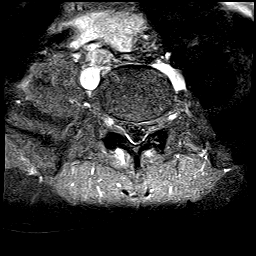
[im 7/25]
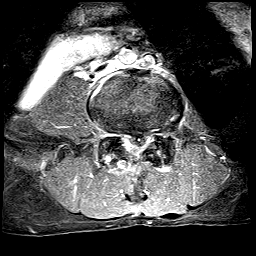
[im 11/25]
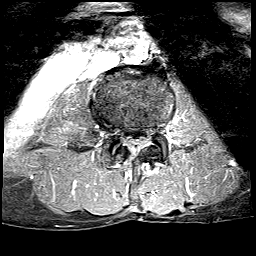
[im 14/25]
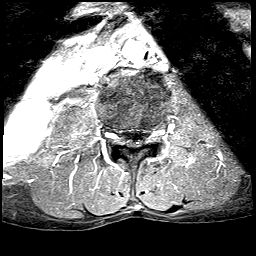
[im 18/25]
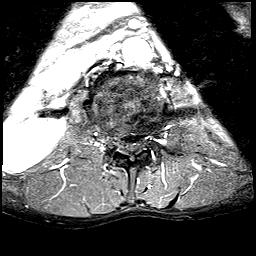
[im 21/25]
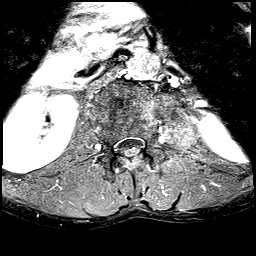
[im 25/25]
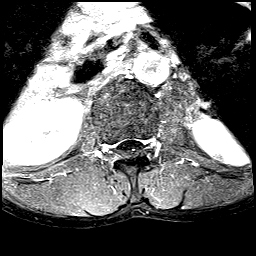

[Series 16: T2 · coronal · 4.0mm · 1.32mm/px · 6 of 20 slices shown (3 of 3)]
[im 1/20]
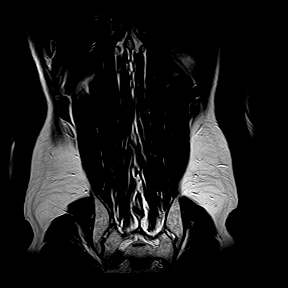
[im 4/20]
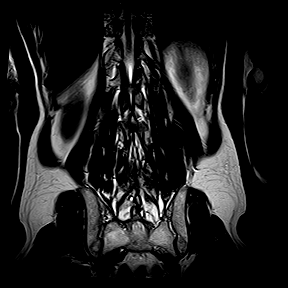
[im 8/20]
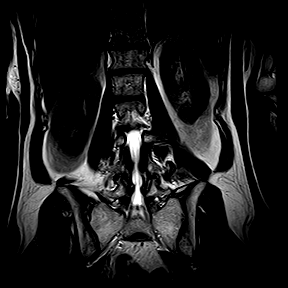
[im 12/20]
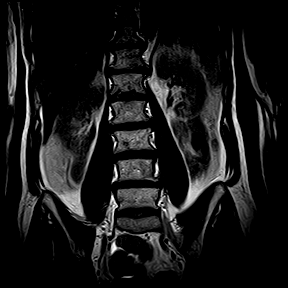
[im 16/20]
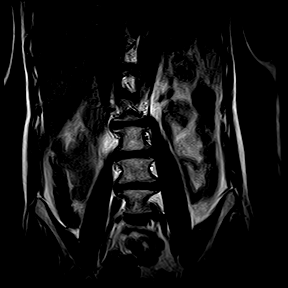
[im 20/20]
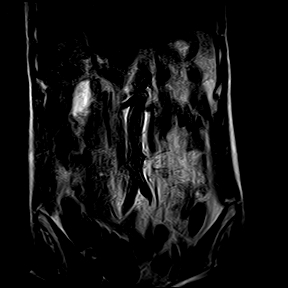

[48 of 48 positions shown; findings below may reference images not displayed]

FINDINGS: No acute bony lesions of lumbar vertebrae are seen.  Lower spinal cord and cauda equina are normal. 

At L1-L2 level, degenerative disc disease and facet arthropathy are causing mild compromise of thecal sac and lateral recesses similar to previous study. 

At L2-L3 level, significant bilateral facet arthropathy and degenerative disc disease are noted causing mild compromise of thecal sac and moderate compromise of both lateral recesses.  AP diameter of thecal sac in the midline measures 9.8 mm.  Findings at this level are unchanged from prior study. 

At L3-L4 level, significant degenerative disc changes and facet arthropathy are causing mild degree of spinal stenosis and moderate compromise of both lateral recesses.  AP diameter of thecal sac in the midline measures 8.9 mm.  Findings at this level are also unchanged from prior study of 10/03/2020. 

At L4-L5 level, significant degenerative disc disease and facet arthropathy with minimal degenerative listhesis are noted with postsurgical changes of dorsal laminectomy.  No abnormal enhancement is seen.  Compromise of lateral recess and neural foramina are noted at this level which is unchanged from prior study of 10/03/2020.  

At L5-S1 level, degenerative disc disease and facet arthropathy are noted causing moderate compromise of both lateral recesses and neural foramina.  Findings are unchanged from previous examination.  

Paravertebral soft tissues are unremarkable.
IMPRESSION: 1. No acute bone changes.  No abnormal enhancement on postcontrast study. 

2. At L4-L5 level, significant degenerative disc disease and facet arthropathy with minimal degenerative listhesis are noted with postsurgical changes of dorsal laminectomy.  No abnormal enhancement is seen.  Compromise of lateral recess and neural foramina are noted at this level which is unchanged from prior study of 10/03/2020.

3. Findings at other disc levels are described at each level in detail above.  Overall findings are unchanged from 10/03/2020.
